# Patient Record
Sex: Female | Born: 1950
Health system: Southern US, Community
[De-identification: ages and names within clinical notes are randomized; demographics above are authoritative.]

## PROBLEM LIST (undated history)

## (undated) DIAGNOSIS — T7840XA Allergy, unspecified, initial encounter: Secondary | ICD-10-CM

## (undated) DIAGNOSIS — C801 Malignant (primary) neoplasm, unspecified: Secondary | ICD-10-CM

## (undated) DIAGNOSIS — M1990 Unspecified osteoarthritis, unspecified site: Secondary | ICD-10-CM

## (undated) HISTORY — PX: TONSILLECTOMY: SUR1361

## (undated) HISTORY — PX: PARTIAL HYSTERECTOMY: SHX80

## (undated) HISTORY — PX: COLONOSCOPY: SHX174

## (undated) HISTORY — DX: Allergy, unspecified, initial encounter: T78.40XA

## (undated) HISTORY — PX: CHOLECYSTECTOMY: SHX55

## (undated) HISTORY — PX: NASAL RECONSTRUCTION: SHX2069

---

## 1999-05-02 ENCOUNTER — Encounter: Payer: Self-pay | Admitting: Obstetrics and Gynecology

## 1999-05-02 ENCOUNTER — Encounter: Admission: RE | Admit: 1999-05-02 | Discharge: 1999-05-02 | Payer: Self-pay | Admitting: Obstetrics and Gynecology

## 2005-10-26 ENCOUNTER — Ambulatory Visit: Payer: Self-pay | Admitting: Internal Medicine

## 2007-12-24 ENCOUNTER — Ambulatory Visit: Payer: Self-pay | Admitting: Internal Medicine

## 2008-01-31 ENCOUNTER — Ambulatory Visit: Payer: Self-pay | Admitting: Internal Medicine

## 2008-01-31 ENCOUNTER — Encounter: Payer: Self-pay | Admitting: Internal Medicine

## 2008-02-03 ENCOUNTER — Encounter: Payer: Self-pay | Admitting: Internal Medicine

## 2009-04-01 ENCOUNTER — Encounter: Admission: RE | Admit: 2009-04-01 | Discharge: 2009-04-01 | Payer: Self-pay | Admitting: Otolaryngology

## 2009-05-07 ENCOUNTER — Ambulatory Visit: Payer: Self-pay | Admitting: Internal Medicine

## 2009-05-07 DIAGNOSIS — Z860100 Personal history of colon polyps, unspecified: Secondary | ICD-10-CM | POA: Insufficient documentation

## 2009-05-07 DIAGNOSIS — E785 Hyperlipidemia, unspecified: Secondary | ICD-10-CM | POA: Insufficient documentation

## 2009-05-07 DIAGNOSIS — R519 Headache, unspecified: Secondary | ICD-10-CM | POA: Insufficient documentation

## 2009-05-07 DIAGNOSIS — R51 Headache: Secondary | ICD-10-CM | POA: Insufficient documentation

## 2009-05-07 DIAGNOSIS — J309 Allergic rhinitis, unspecified: Secondary | ICD-10-CM | POA: Insufficient documentation

## 2010-05-03 NOTE — Assessment & Plan Note (Signed)
Summary: PT RE-EST // RS   Vital Signs:  Patient profile:   60 year old female Height:      64 inches Weight:      161 pounds BMI:     27.74 Temp:     98.6 degrees F oral Pulse rate:   60 / minute BP sitting:   130 / 72  (left arm) Cuff size:   regular  Vitals Entered By: Raechel Ache, RN (May 07, 2009 2:09 PM) CC: to re establish , c/o head "aching" , neck discomfort   CC:  to re establish , c/o head "aching" , and neck discomfort.  History of Present Illness: 60 year old female, who is seen today to reestablish with our practice.  She is followed by Adolph Pollack, allergy and also by gynecology is also had a recent ENT evaluation.  Complaints include some vague fullness in the head and neck discomfort.  She has been a valuated by ENT.  Due to an unusual  symptom complex described as loss of taste sensation when she chews on the right side of her mouth.  A sinus CT scan was negative  Preventive Screening-Counseling & Management  Alcohol-Tobacco     Smoking Status: never  Allergies (verified): No Known Drug Allergies  Past History:  Past Medical History: Allergic rhinitis [place], TEOH) Colonic polyps, hx of Jarold Motto) Headache Hyperlipidemia gravida 3, para 3, aborta zero, status post C-section x 3 ([place])  Past Surgical History: Cholecystectomy 1999 Hysterectomy 1995 Sinus surgery 1980s  Tonsillectomy 1974  Family History: both parents, age  3  father history of coronary artery disease, status post MI, kidney stones, hypertension  Mother, hypercholesterolemia, glaucoma, DJD, hypertension  Two sisters, one with Lyme disease  Social History: Reviewed history and no changes required. Married Never Smoked Smoking Status:  never  Review of Systems  The patient denies anorexia, fever, weight loss, weight gain, vision loss, decreased hearing, hoarseness, chest pain, syncope, dyspnea on exertion, peripheral edema, prolonged cough, headaches, hemoptysis,  abdominal pain, melena, hematochezia, severe indigestion/heartburn, hematuria, incontinence, genital sores, muscle weakness, suspicious skin lesions, transient blindness, difficulty walking, depression, unusual weight change, abnormal bleeding, enlarged lymph nodes, angioedema, and breast masses.    Physical Exam  General:  Well-developed,well-nourished,in no acute distress; alert,appropriate and cooperative throughout examination Head:  Normocephalic and atraumatic without obvious abnormalities. No apparent alopecia or balding. Eyes:  No corneal or conjunctival inflammation noted. EOMI. Perrla. Funduscopic exam benign, without hemorrhages, exudates or papilledema. Vision grossly normal. Ears:  External ear exam shows no significant lesions or deformities.  Otoscopic examination reveals clear canals, tympanic membranes are intact bilaterally without bulging, retraction, inflammation or discharge. Hearing is grossly normal bilaterally. Nose:  External nasal examination shows no deformity or inflammation. Nasal mucosa are pink and moist without lesions or exudates. Mouth:  Oral mucosa and oropharynx without lesions or exudates.  Teeth in good repair. Neck:  No deformities, masses, or tenderness noted. Chest Wall:  No deformities, masses, or tenderness noted. Lungs:  Normal respiratory effort, chest expands symmetrically. Lungs are clear to auscultation, no crackles or wheezes. Heart:  Normal rate and regular rhythm. S1 and S2 normal without gallop, murmur, click, rub or other extra sounds. Abdomen:  Bowel sounds positive,abdomen soft and non-tender without masses, organomegaly or hernias noted. Msk:  No deformity or scoliosis noted of thoracic or lumbar spine.   Pulses:  R and L carotid,radial,femoral,dorsalis pedis and posterior tibial pulses are full and equal bilaterally Extremities:  No clubbing, cyanosis, edema, or  deformity noted with normal full range of motion of all joints.     Impression  & Recommendations:  Problem # 1:  HYPERLIPIDEMIA (ICD-272.4)  Problem # 2:  COLONIC POLYPS, HX OF (ICD-V12.72)  Problem # 3:  ALLERGIC RHINITIS (ICD-477.9)  Her updated medication list for this problem includes:    Allegra 180 Mg Tabs (Fexofenadine hcl) .Marland Kitchen... Prn    Astepro 0.15 % Soln (Azelastine hcl) ..... Use daily    Nasonex 50 Mcg/act Susp (Mometasone furoate) ..... Use daily  Complete Medication List: 1)  Premarin 0.625 Mg/gm Crea (Estrogens, conjugated) .... 3 times a week 2)  Allegra 180 Mg Tabs (Fexofenadine hcl) .... Prn 3)  Astepro 0.15 % Soln (Azelastine hcl) .... Use daily 4)  Nasonex 50 Mcg/act Susp (Mometasone furoate) .... Use daily  Patient Instructions: 1)  Please schedule a follow-up appointment as needed.

## 2010-05-03 NOTE — Letter (Signed)
Summary: Patient Notice- Polyp Results  [facility]  [address] [place], [place] [address]   Phone: [phone]  Fax: [phone]        February 03, 2008 MRN: [id number]    [facility] [place] [address] [place], [place]  [address]    Dear Ms. Shutes,  I am pleased to inform you that the colon polyp(s) removed during your recent colonoscopy was (were) found to be benign (no cancer detected) upon pathologic examination.  I recommend you have a repeat colonoscopy examination in 5 years to look for recurrent polyps, as having colon polyps increases your risk for having recurrent polyps or even colon cancer in the future.  Should you develop new or worsening symptoms of abdominal pain, bowel habit changes or bleeding from the rectum or bowels, please schedule an evaluation with either your primary care physician or with me.  Please call us if you are having persistent problems or have questions about your condition that have not been fully answered at this time.   Sincerely,  Iva Boop MD  This letter has been electronically signed by your physician.

## 2010-05-03 NOTE — Procedures (Signed)
Summary: Colonoscopy   Colonoscopy  Procedure date:  01/31/2008  Findings:      Location:  [facility].    Procedures Next Due Date:    Colonoscopy: 02/2013  Patient Name: Krise, Akeya Ryther. MRN:  Procedure Procedures: Colonoscopy CPT: [phone].    with polypectomy. CPT: [id number].  Personnel: Endoscopist: Iva Boop, MD, [facility].  Exam Location: Exam performed in Outpatient Clinic. Outpatient  Patient Consent: Procedure, Alternatives, Risks and Benefits discussed, consent obtained, from patient. Consent was obtained by the RN.  Indications  Surveillance of: Adenomatous Polyp(s). This is an initial surveillance exam. Initial polypectomy was performed in 2004. in Jun. 3 or more Polyps were found at Index Exam. Largest polyp removed was 1 to 5 mm. Pathology of worst  polyp: tubular adenoma. History  Current Medications: Patient is not currently taking Coumadin.  Pre-Exam Physical: Performed Jan 31, 2008. Cardio-pulmonary exam, Rectal exam, HEENT exam , Abdominal exam, Mental status exam WNL.  Comments: Pt. history reviewed/updated, physical exam performed prior to initiation of sedation? YES Exam Exam: Extent of exam reached: Cecum, extent intended: Cecum.  Time to Cecum: 00:03:06. Time for Withdrawl: 00:12:15. Colon retroflexion performed. Images taken. ASA Classification: I. Tolerance: excellent.  Monitoring: Pulse and BP monitoring, Oximetry used. Supplemental O2 given.  Colon Prep Used MiraLax for colon prep. Prep results: good.  Sedation Meds: Patient assessed and found to be appropriate for moderate (conscious) sedation. Fentanyl 75 mcg. given IV. Versed 7 mg. given IV.  Findings - NORMAL EXAM: Cecum to Sigmoid Colon.  - MULTIPLE POLYPS: Sigmoid Colon. minimum size 3 mm, maximum size 5 mm. Procedure:  snare without cautery, removed, Polyp retrieved, 2 polyps Polyps sent to pathology. Path # 1.  HEMORRHOIDS: External. Size: Grade I.    Assessment  Comments: 1) TWO DIMINUTIVE SIGMOID POLYPS REMOVED 2) SMALL EXTERNAL HEMORRHOIDS 3) OTHERWISE NORMAL WITH GOOD PREP 4) PRIOR ADENOMATOUS COLON POLYPS Events  Unplanned Interventions: No intervention was required.  Plans Medication Plan: Await pathology. Continue current medications.  Disposition: After procedure patient sent to recovery. After recovery patient sent home.  Scheduling/Referral: Await pathology to schedule patient. Path Letter, to The Patient,     REPORT OF SURGICAL PATHOLOGY   Case #: [id number] Patient Name: SIROIS, NARCISSUS DETWILER. Office Chart Number:  [id number]   MRN: [id number] Pathologist: Alden Server A. Delila Spence, MD DOB/Age  Jun 07, 1950 (Age: 60)    Gender: F Date Taken:  01/31/2008 Date Received: 01/31/2008   FINAL DIAGNOSIS   ***MICROSCOPIC EXAMINATION AND DIAGNOSIS***   SIGMOID COLON, POLYP(S):  HYPERPLASTIC POLYP.  NO ADENOMATOUS CHANGE OR MALIGNANCY IDENTIFIED.   mj Date Reported:  02/03/2008     Lyn Hollingshead. Delila Spence, MD *** Electronically Signed Out By EAA ***      February 03, 2008 MRN: [id number]    [facility] [place] [address] [place], [place]  [address]    Dear Ms. Kronick,  I am pleased to inform you that the colon polyp(s) removed during your recent colonoscopy was (were) found to be benign (no cancer detected) upon pathologic examination.  I recommend you have a repeat colonoscopy examination in 5 years to look for recurrent polyps, as having colon polyps increases your risk for having recurrent polyps or even colon cancer in the future.  Should you develop new or worsening symptoms of abdominal pain, bowel habit changes or bleeding from the rectum or bowels, please schedule an evaluation with either your primary care physician or with me.  Please call us if you are having  persistent problems or have questions about your condition that have not been fully answered at this time.   Sincerely,  Iva Boop MD  This letter  has been electronically signed by your physician.   Signed by Iva Boop MD on 02/03/2008 at 12:13 PM  ________________________________________________________________________ This report was created from the original endoscopy report, which was reviewed and signed by the above listed endoscopist.

## 2010-05-03 NOTE — Miscellaneous (Signed)
Summary: GI PV  Clinical Lists Changes  Medications: Added new medication of BISACODYL EC 5 MG TBEC (BISACODYL) Day before procedure take 2 at 3pm and 2 at 8pm. - Signed Added new medication of METOCLOPRAMIDE HCL 10 MG  TABS (METOCLOPRAMIDE HCL) As per prep instructions. - Signed Added new medication of MIRALAX   POWD (POLYETHYLENE GLYCOL 3350) As per prep  instructions. - Signed Rx of BISACODYL EC 5 MG TBEC (BISACODYL) Day before procedure take 2 at 3pm and 2 at 8pm.;  #4 x 0;  Signed;  Entered by: Barton Fanny RN;  Authorized by: Iva Boop MD;  Method used: Electronically to [employer]. [facility]. # [phone]*, [address] [place], [place], [place]  [address], Ph: [phone] or [phone], Fax: [phone] Rx of METOCLOPRAMIDE HCL 10 MG  TABS (METOCLOPRAMIDE HCL) As per prep instructions.;  #2 x 0;  Signed;  Entered by: Barton Fanny RN;  Authorized by: Iva Boop MD;  Method used: Electronically to [employer]. [facility]. # [phone]*, [address], [place], [place], [place]  [address], Ph: [phone] or [phone], Fax: [phone] Rx of MIRALAX   POWD (POLYETHYLENE GLYCOL 3350) As per prep  instructions.;  #255gm x 0;  Signed;  Entered by: Barton Fanny RN;  Authorized by: Iva Boop MD;  Method used: Electronically to [employer]. [facility]. # [phone]*, [address], [place], [place], [place]  [address], Ph: [phone] or [phone], Fax: [phone] Observations: Added new observation of NKA: T (12/24/2007 16:04)    Prescriptions: MIRALAX   POWD (POLYETHYLENE GLYCOL 3350) As per prep  instructions.  #255gm x 0   Entered by:   Barton Fanny RN   Authorized by:   Iva Boop MD   Signed by:   Barton Fanny RN on 12/24/2007   Method used:   Electronically to        [employer] [facility]. [address] # [phone]* (retail)       [address]       [place], [place]  [address]       Ph: [phone] or [phone]       Fax: [phone]   RxID:    [id number] METOCLOPRAMIDE HCL 10 MG  TABS (METOCLOPRAMIDE HCL) As per prep instructions.  #2 x 0   Entered by:   Barton Fanny RN   Authorized by:   Iva Boop MD   Signed by:   Barton Fanny RN on 12/24/2007   Method used:   Electronically to        [employer] [facility]. [address] # [phone]* (retail)       [address]       [place], [place]  [address]       Ph: [phone] or [phone]       Fax: [phone]   RxID:   [id number] BISACODYL EC 5 MG TBEC (BISACODYL) Day before procedure take 2 at 3pm and 2 at 8pm.  #4 x 0   Entered by:   Barton Fanny RN   Authorized by:   Iva Boop MD   Signed by:   Barton Fanny RN on 12/24/2007   Method used:   Electronically to        [facility] [address] # [phone]* (retail)       [address]  [place], [place]  [address]       Ph: [phone] or [phone]       Fax: [phone]   RxID:   [id number]

## 2010-11-29 IMAGING — CT CT MAXILLOFACIAL W/O CM
3 series · 16 of 47 positions shown, 19 images · non-contrast
Comparison: None

CLINICAL DATA: Chronic sinusitis.  Previous septoplasty.

CT MAXILLOFACIAL WITHOUT CONTRAST
TECHNIQUE: Multidetector CT imaging of the maxillofacial
structures was performed. Multiplanar CT image reconstructions were
also generated.

[Series 4: ax soft · axial · 0.31mm/px · z∈[-68,+35]mm · 10 of 49 slices shown, 13 images]
[im 4/49  brain]
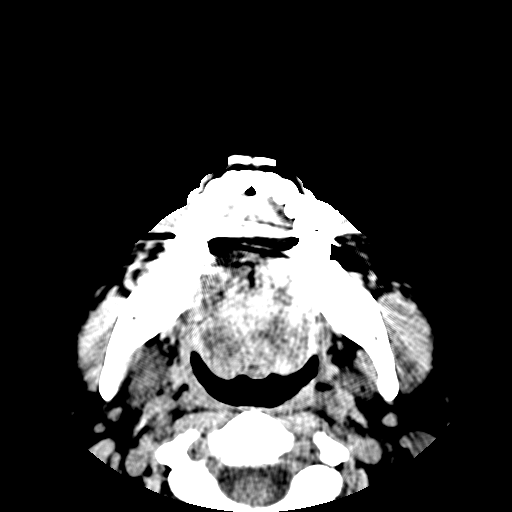
[im 4/49  bone]
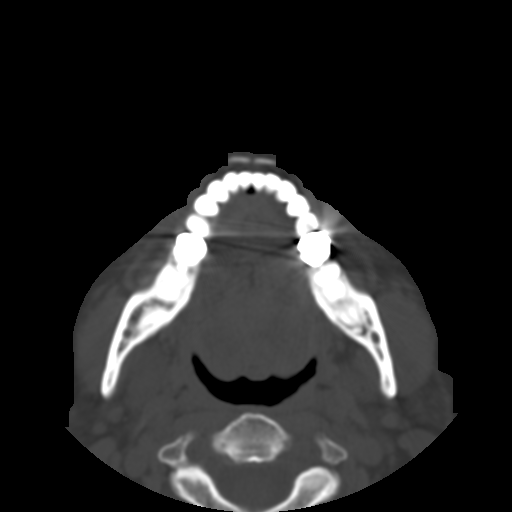
[im 9/49  bone]
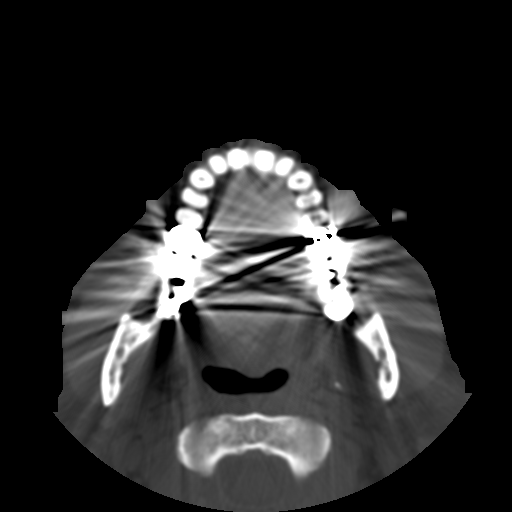
[im 14/49  bone]
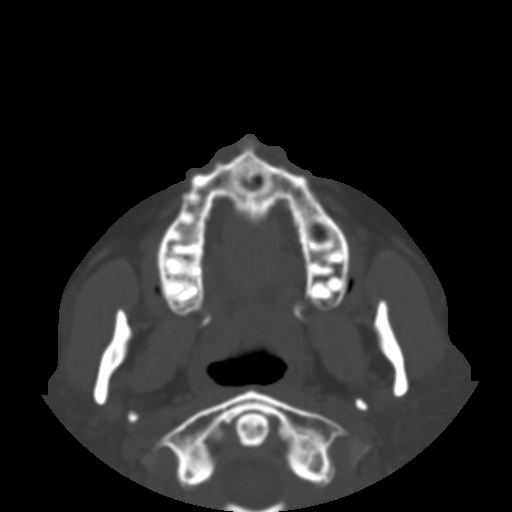
[im 17/49  bone]
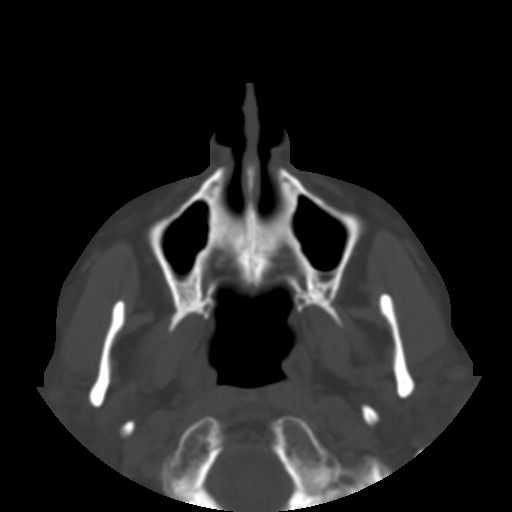
[im 22/49  brain]
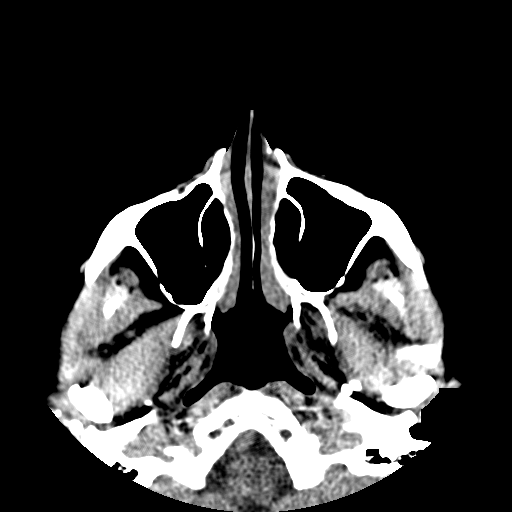
[im 22/49  bone]
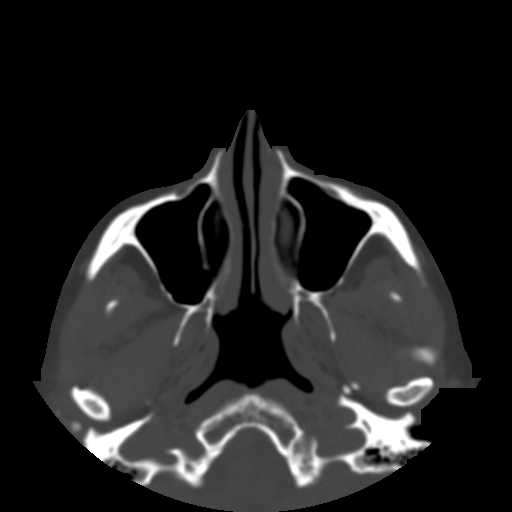
[im 27/49  bone]
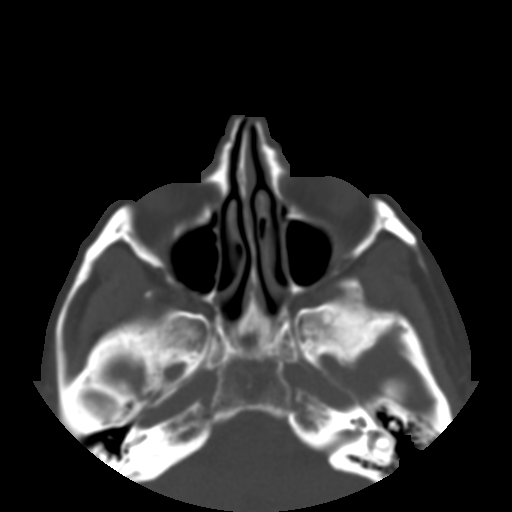
[im 32/49  bone]
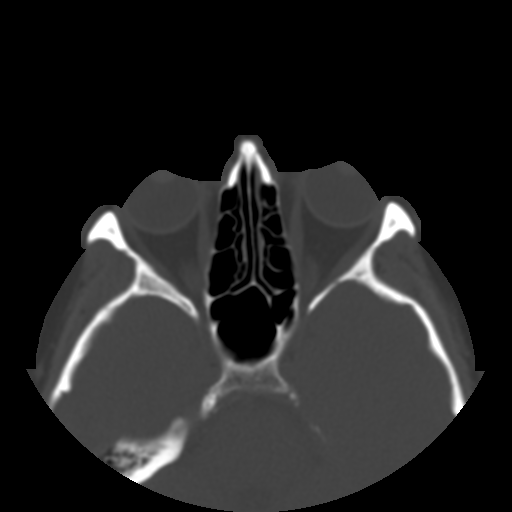
[im 37/49  bone]
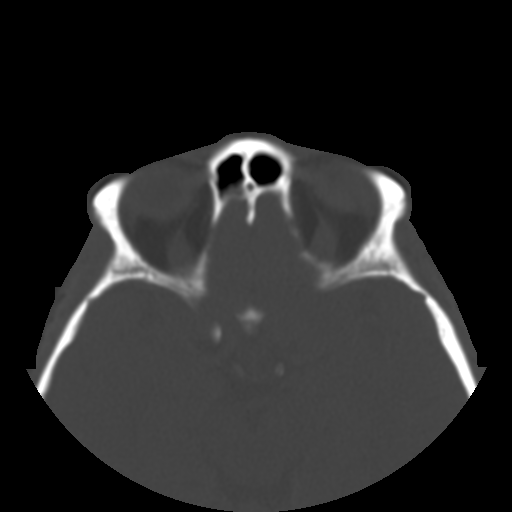
[im 40/49  brain]
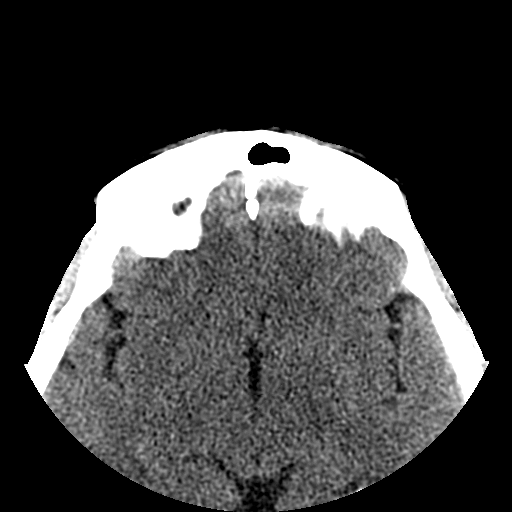
[im 40/49  bone]
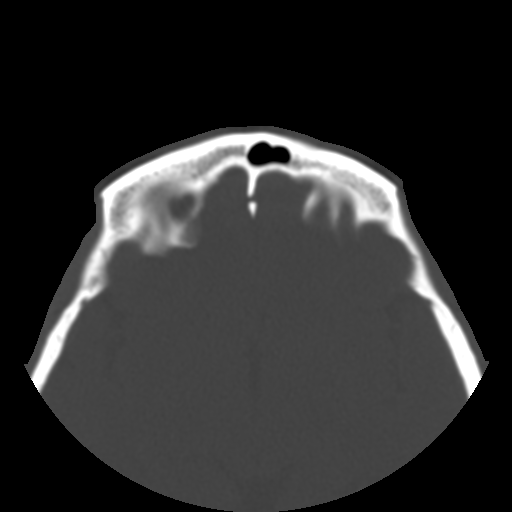
[im 45/49  bone]
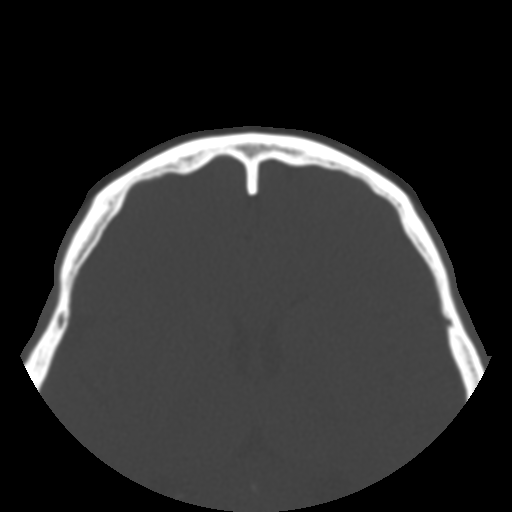

[Series 103: cor soft · coronal · 0.31mm/px · 3 of 55 slices shown]
[im 25/55  bone]
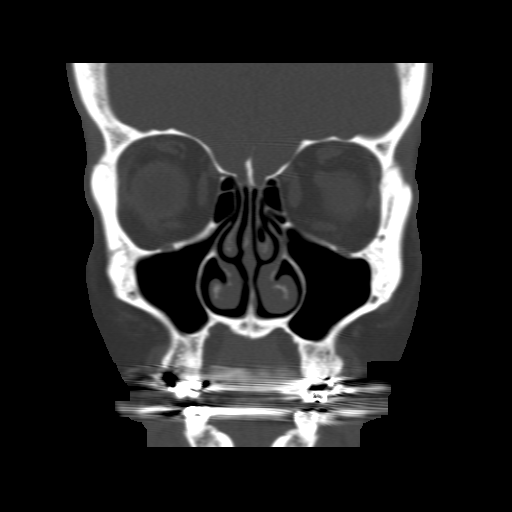
[im 31/55  bone]
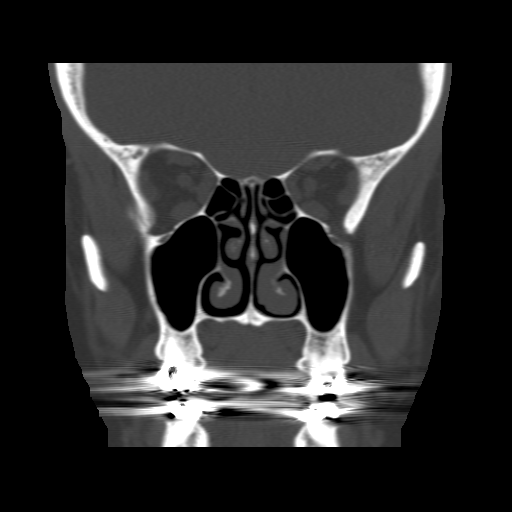
[im 37/55  bone]
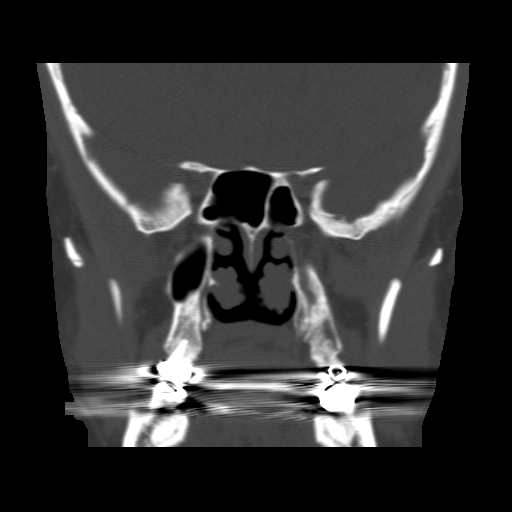

[Series 104: sag soft · sagittal · 0.31mm/px · 3 of 64 slices shown]
[im 22/64  bone]
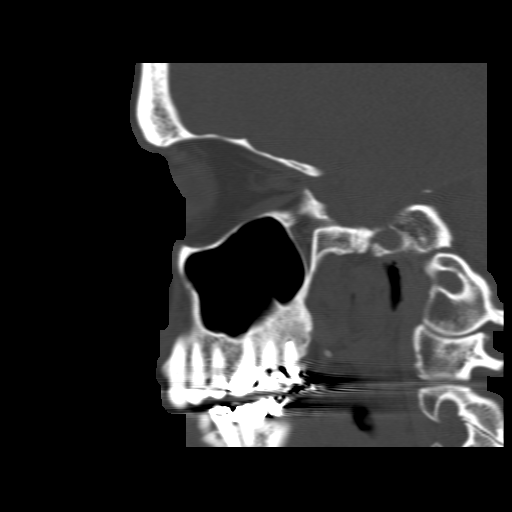
[im 32/64  bone]
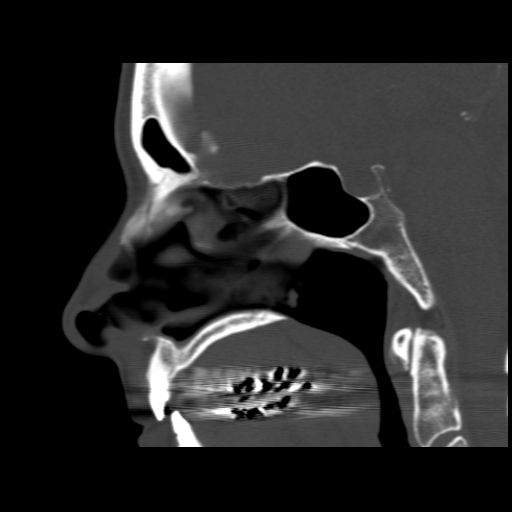
[im 43/64  bone]
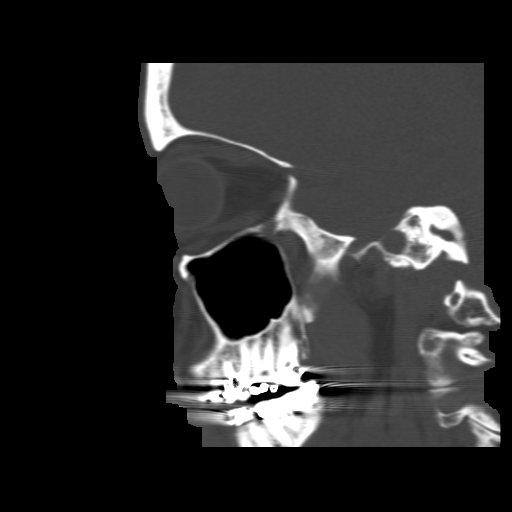

[16 of 47 positions shown; findings below may reference images not displayed]

FINDINGS: Frontal, ethmoid, sphenoid and maxillary sinuses are
completely clear without mucosal thickening, fluid, polyp, cyst or
mass with the exception of two very tiny retention cysts in the
right maxillary sinus, no larger than 3 mm in size each.  Nasal
septum is deviated slightly towards the right.  Ostiomeatal
complexes appear normal bilaterally and widely patent. No
unfavorable anatomic variants are evident.

There does appear to be.  Focal lucency associated with the roots
of the left mandibular molar.  This could be a peri root abscess or
a radicular cyst.
IMPRESSION: No inflammatory disease in the sinuses.  No unfavorable anatomic
variants.  See above discussion.

## 2011-03-23 ENCOUNTER — Other Ambulatory Visit: Payer: Self-pay | Admitting: Dermatology

## 2011-12-05 ENCOUNTER — Telehealth: Payer: Self-pay | Admitting: Internal Medicine

## 2011-12-05 NOTE — Telephone Encounter (Signed)
Pt would like a rx for shingle vaccine sent to [facility]

## 2011-12-05 NOTE — Telephone Encounter (Signed)
rx faxed -attempt to call cell# - VM - LMTCB if questions-  need to be seen - last seen 2011

## 2012-03-19 ENCOUNTER — Other Ambulatory Visit (INDEPENDENT_AMBULATORY_CARE_PROVIDER_SITE_OTHER): Payer: BC Managed Care – PPO

## 2012-03-19 DIAGNOSIS — Z0000 Encounter for general adult medical examination without abnormal findings: Secondary | ICD-10-CM

## 2012-03-19 LAB — CBC WITH DIFFERENTIAL/PLATELET
Basophils Absolute: 0 10*3/uL (ref 0.0–0.1)
Basophils Relative: 0.3 % (ref 0.0–3.0)
Eosinophils Absolute: 0.1 10*3/uL (ref 0.0–0.7)
Eosinophils Relative: 1.5 % (ref 0.0–5.0)
HCT: 39.4 % (ref 36.0–46.0)
Hemoglobin: 13.1 g/dL (ref 12.0–15.0)
Lymphocytes Relative: 41.5 % (ref 12.0–46.0)
Lymphs Abs: 2.5 10*3/uL (ref 0.7–4.0)
MCHC: 33.3 g/dL (ref 30.0–36.0)
MCV: 85.3 fl (ref 78.0–100.0)
Monocytes Absolute: 0.4 10*3/uL (ref 0.1–1.0)
Monocytes Relative: 5.8 % (ref 3.0–12.0)
Neutro Abs: 3.1 10*3/uL (ref 1.4–7.7)
Neutrophils Relative %: 50.9 % (ref 43.0–77.0)
Platelets: 246 10*3/uL (ref 150.0–400.0)
RBC: 4.62 Mil/uL (ref 3.87–5.11)
RDW: 12.6 % (ref 11.5–14.6)
WBC: 6 10*3/uL (ref 4.5–10.5)

## 2012-03-19 LAB — POCT URINALYSIS DIPSTICK
Bilirubin, UA: NEGATIVE
Glucose, UA: NEGATIVE
Ketones, UA: NEGATIVE
Leukocytes, UA: NEGATIVE
Nitrite, UA: NEGATIVE
Protein, UA: NEGATIVE
Spec Grav, UA: 1.015
Urobilinogen, UA: 0.2
pH, UA: 7

## 2012-03-19 LAB — BASIC METABOLIC PANEL
BUN: 14 mg/dL (ref 6–23)
CO2: 24 mEq/L (ref 19–32)
Calcium: 9.2 mg/dL (ref 8.4–10.5)
Chloride: 106 mEq/L (ref 96–112)
Creatinine, Ser: 0.5 mg/dL (ref 0.4–1.2)
GFR: 130.54 mL/min (ref >60.00)
Glucose, Bld: 105 mg/dL — ABNORMAL HIGH (ref 70–99)
Potassium: 3.6 mEq/L (ref 3.5–5.1)
Sodium: 138 mEq/L (ref 135–145)

## 2012-03-19 LAB — HEPATIC FUNCTION PANEL
ALT: 17 U/L (ref 0–35)
AST: 19 U/L (ref 0–37)
Albumin: 4.2 g/dL (ref 3.5–5.2)
Alkaline Phosphatase: 50 U/L (ref 39–117)
Bilirubin, Direct: 0 mg/dL (ref 0.0–0.3)
Total Bilirubin: 0.7 mg/dL (ref 0.3–1.2)
Total Protein: 7 g/dL (ref 6.0–8.3)

## 2012-03-19 LAB — TSH: TSH: 1.74 u[IU]/mL (ref 0.35–5.50)

## 2012-03-19 LAB — LIPID PANEL
Cholesterol: 190 mg/dL (ref 0–200)
HDL: 39.4 mg/dL (ref >39.00)
LDL Cholesterol: 135 mg/dL — ABNORMAL HIGH (ref 0–99)
Total CHOL/HDL Ratio: 5
Triglycerides: 76 mg/dL (ref 0.0–149.0)
VLDL: 15.2 mg/dL (ref 0.0–40.0)

## 2012-03-25 ENCOUNTER — Encounter: Payer: Self-pay | Admitting: Internal Medicine

## 2012-03-25 ENCOUNTER — Ambulatory Visit (INDEPENDENT_AMBULATORY_CARE_PROVIDER_SITE_OTHER): Payer: BC Managed Care – PPO | Admitting: Internal Medicine

## 2012-03-25 VITALS — BP 120/80 | HR 76 | Temp 97.9°F | Resp 18 | Ht 64.0 in | Wt 156.0 lb

## 2012-03-25 DIAGNOSIS — Z0000 Encounter for general adult medical examination without abnormal findings: Secondary | ICD-10-CM

## 2012-03-25 NOTE — Progress Notes (Signed)
Subjective:    Patient ID: Stephanie Henry, female    DOB: [birth year], 61 y.o.   MRN: [id number]  HPI  History of Present Illness:   61 year-old female, who is seen today for a preventive health examination. She is followed by Allergy and also by gynecology is also had a recent ENT evaluation. Complaints include some vague fullness in the head and neck discomfort. She has been a valuated by ENT.   Alcohol-Tobacco  Smoking Status: never   Allergies (verified):  No Known Drug Allergies  Past History:  Past Medical History:  Allergic rhinitis [place], TEOH)  Colonic polyps, hx of Jarold Motto)  Headache  Hyperlipidemia  gravida 3, para 3, aborta zero, status post C-section x 3 ([place])   Past Surgical History:  Cholecystectomy 1999  Hysterectomy 1995  Sinus surgery 1980s  Tonsillectomy 1974  Colonoscopy 2009   Family History:  both parents, age 60  father history of coronary artery disease, status post MI, kidney stones, hypertension  Mother, hypercholesterolemia, glaucoma, DJD, hypertension  Two sisters, one with Lyme disease   Social History:  Reviewed history and no changes required.  Married  Never Smoked  Smoking Status: never   No past medical history on file.  History   Social History  . Marital Status: Married    Spouse Name: N/A    Number of Children: N/A  . Years of Education: N/A   Occupational History  . Not on file.   Social History Main Topics  . Smoking status: Never Smoker   . Smokeless tobacco: Not on file  . Alcohol Use: No  . Drug Use: No  . Sexually Active: Yes -- Female partner(s)   Other Topics Concern  . Not on file   Social History Narrative  . No narrative on file    No past surgical history on file.  No family history on file.  No Known Allergies  Current Outpatient Prescriptions on File Prior to Visit  Medication Sig Dispense Refill  . Calcium Carb-Cholecalciferol (CALCIUM + D3) 600-200 MG-UNIT TABS Take 1 tablet by  mouth daily.        BP 120/80  Pulse 76  Temp 97.9 F (36.6 C) (Oral)  Resp 18  Ht 5\' 4"  (1.626 m)  Wt 156 lb (70.761 kg)  BMI 26.78 kg/m2  SpO2 97%     Review of Systems  Constitutional: Negative for fever, appetite change, fatigue and unexpected weight change.  HENT: Negative for hearing loss, ear pain, nosebleeds, congestion, sore throat, mouth sores, trouble swallowing, neck stiffness, dental problem, voice change, sinus pressure and tinnitus.   Eyes: Negative for photophobia, pain, redness and visual disturbance.  Respiratory: Negative for cough, chest tightness and shortness of breath.   Cardiovascular: Negative for chest pain, palpitations and leg swelling.  Gastrointestinal: Negative for nausea, vomiting, abdominal pain, diarrhea, constipation, blood in stool, abdominal distention and rectal pain.  Genitourinary: Negative for dysuria, urgency, frequency, hematuria, flank pain, vaginal bleeding, vaginal discharge, difficulty urinating, genital sores, vaginal pain, menstrual problem and pelvic pain.  Musculoskeletal: Negative for back pain and arthralgias.  Skin: Negative for rash.  Neurological: Negative for dizziness, syncope, speech difficulty, weakness, light-headedness, numbness and headaches.  Hematological: Negative for adenopathy. Does not bruise/bleed easily.  Psychiatric/Behavioral: Negative for suicidal ideas, behavioral problems, self-injury, dysphoric mood and agitation. The patient is not nervous/anxious.        Objective:   Physical Exam  Constitutional: She is oriented to person, place, and time. She appears well-developed and  well-nourished.  HENT:  Head: Normocephalic.  Right Ear: External ear normal.  Left Ear: External ear normal.  Mouth/Throat: Oropharynx is clear and moist.  Eyes: Conjunctivae normal and EOM are normal. Pupils are equal, round, and reactive to light.  Neck: Normal range of motion. Neck supple. No thyromegaly present.   Cardiovascular: Normal rate, regular rhythm, normal heart sounds and intact distal pulses.   Pulmonary/Chest: Effort normal and breath sounds normal.  Abdominal: Soft. Bowel sounds are normal. She exhibits no mass. There is no tenderness.  Musculoskeletal: Normal range of motion.  Lymphadenopathy:    She has no cervical adenopathy.  Neurological: She is alert and oriented to person, place, and time.  Skin: Skin is warm and dry. No rash noted.  Psychiatric: She has a normal mood and affect. Her behavior is normal.          Assessment & Plan:   preventive health examination  History colonic polyps. Followup colonoscopy 2014  Allergic rhinitis stable   Return office visit one or 2 years

## 2012-03-25 NOTE — Patient Instructions (Signed)
Take a calcium supplement, plus 800-1200 units of vitamin D    It is important that you exercise regularly, at least 20 minutes 3 to 4 times per week.  If you develop chest pain or shortness of breath seek  medical attention.   

## 2013-03-03 ENCOUNTER — Encounter: Payer: Self-pay | Admitting: Internal Medicine

## 2013-04-29 ENCOUNTER — Encounter: Payer: Self-pay | Admitting: Internal Medicine

## 2013-05-19 ENCOUNTER — Ambulatory Visit (AMBULATORY_SURGERY_CENTER): Payer: Self-pay | Admitting: *Deleted

## 2013-05-19 VITALS — Ht 64.5 in | Wt 158.0 lb

## 2013-05-19 DIAGNOSIS — Z86010 Personal history of colonic polyps: Secondary | ICD-10-CM

## 2013-05-19 MED ORDER — NA SULFATE-K SULFATE-MG SULF 17.5-3.13-1.6 GM/177ML PO SOLN: ORAL | Status: DC

## 2013-05-19 NOTE — Progress Notes (Signed)
Patient denies any allergies to eggs or soy. Patient denies any problems with anesthesia.  

## 2013-06-03 ENCOUNTER — Ambulatory Visit (AMBULATORY_SURGERY_CENTER): Payer: BC Managed Care – PPO | Admitting: Internal Medicine

## 2013-06-03 ENCOUNTER — Encounter: Payer: Self-pay | Admitting: Internal Medicine

## 2013-06-03 VITALS — BP 124/60 | HR 67 | Temp 98.6°F | Resp 13 | Ht 64.0 in | Wt 158.0 lb

## 2013-06-03 DIAGNOSIS — Z86010 Personal history of colonic polyps: Secondary | ICD-10-CM

## 2013-06-03 MED ORDER — SODIUM CHLORIDE 0.9 % IV SOLN: 500.0000 mL | INTRAVENOUS | Status: DC

## 2013-06-03 NOTE — Patient Instructions (Addendum)
 No polyps today! Next routine colonoscopy in 10 years - 2025  I appreciate the opportunity to care for you. Carl E. Gessner, MD, FACG  YOU HAD AN ENDOSCOPIC PROCEDURE TODAY AT THE [facility]: Refer to the procedure report that was given to you for any specific questions about what was found during the examination.  If the procedure report does not answer your questions, please call your gastroenterologist to clarify.  If you requested that your care partner not be given the details of your procedure findings, then the procedure report has been included in a sealed envelope for you to review at your convenience later.  YOU SHOULD EXPECT: Some feelings of bloating in the abdomen. Passage of more gas than usual.  Walking can help get rid of the air that was put into your GI tract during the procedure and reduce the bloating. If you had a lower endoscopy (such as a colonoscopy or flexible sigmoidoscopy) you may notice spotting of blood in your stool or on the toilet paper. If you underwent a bowel prep for your procedure, then you may not have a normal bowel movement for a few days.  DIET: Your first meal following the procedure should be a light meal and then it is ok to progress to your normal diet.  A half-sandwich or bowl of soup is an example of a good first meal.  Heavy or fried foods are harder to digest and may make you feel nauseous or bloated.  Likewise meals heavy in dairy and vegetables can cause extra gas to form and this can also increase the bloating.  Drink plenty of fluids but you should avoid alcoholic beverages for 24 hours.  ACTIVITY: Your care partner should take you home directly after the procedure.  You should plan to take it easy, moving slowly for the rest of the day.  You can resume normal activity the day after the procedure however you should NOT DRIVE or use heavy machinery for 24 hours (because of the sedation medicines used during the test).     SYMPTOMS TO REPORT IMMEDIATELY: A gastroenterologist can be reached at any hour.  During normal business hours, 8:30 AM to 5:00 PM Monday through Friday, call [phone].  After hours and on weekends, please call the GI answering service at [phone] who will take a message and have the physician on call contact you.   Following lower endoscopy (colonoscopy or flexible sigmoidoscopy):  Excessive amounts of blood in the stool  Significant tenderness or worsening of abdominal pains  Swelling of the abdomen that is new, acute  Fever of 100F or higher   FOLLOW UP: If any biopsies were taken you will be contacted by phone or by letter within the next 1-3 weeks.  Call your gastroenterologist if you have not heard about the biopsies in 3 weeks.  Our staff will call the home number listed on your records the next business day following your procedure to check on you and address any questions or concerns that you may have at that time regarding the information given to you following your procedure. This is a courtesy call and so if there is no answer at the home number and we have not heard from you through the emergency physician on call, we will assume that you have returned to your regular daily activities without incident.  SIGNATURES/CONFIDENTIALITY: You and/or your care partner have signed paperwork which will be entered into your electronic medical record.  These signatures   to the fact that that the information above on your After Visit Summary has been reviewed and is understood.  Full responsibility of the confidentiality of this discharge information lies with you and/or your care-partner. 

## 2013-06-03 NOTE — Op Note (Signed)
[facility]  [employer] [address]. [place], [address]   COLONOSCOPY PROCEDURE REPORT  PATIENT: Stephanie Henry, Stephanie Henry  MR#: [id number] BIRTHDATE: Feb 03, 1951 , 62  yrs. old GENDER: Female ENDOSCOPIST: Gatha Mayer, MD, [facility] PROCEDURE DATE:  06/03/2013 PROCEDURE:   Colonoscopy, surveillance First Screening Colonoscopy - Avg.  risk and is 50 yrs.  old or older - No.  Prior Negative Screening - Now for repeat screening. N/A  History of Adenoma - Now for follow-up colonoscopy & has been > or = to 3 yrs.  Yes hx of adenoma.  Has been 3 or more years since last colonoscopy.  Polyps Removed Today? No.  Recommend repeat exam, <10 yrs? No. ASA CLASS:   Class I INDICATIONS:Patient's personal history of adenomatous colon polyps.  MEDICATIONS: propofol (Diprivan) 200mg  IV, MAC sedation, administered by CRNA, and These medications were titrated to patient response per physician's verbal order  DESCRIPTION OF PROCEDURE:   After the risks benefits and alternatives of the procedure were thoroughly explained, informed consent was obtained.  A digital rectal exam revealed no abnormalities of the rectum.   The LB [id number]-[id number]  endoscope was introduced through the anus and advanced to the cecum, which was identified by both the appendix and ileocecal valve. No adverse events experienced.   The quality of the prep was Suprep good  The instrument was then slowly withdrawn as the colon was fully examined.      COLON FINDINGS: A normal appearing cecum, ileocecal valve, and appendiceal orifice were identified.  The ascending, hepatic flexure, transverse, splenic flexure, descending, sigmoid colon and rectum appeared unremarkable.  No polyps or cancers were seen.   A right colon retroflexion was performed.  Retroflexed views revealed no abnormalities. The time to cecum=2 minutes 56 seconds. Withdrawal time=9 minutes 21 seconds.  The scope was withdrawn and the procedure  completed. COMPLICATIONS: There were no complications.  ENDOSCOPIC IMPRESSION: Normal colonoscopy - good prep - hx adenoma 2004  RECOMMENDATIONS: Repeat Colonscopy in 10 years - 2025   eSigned:  Gatha Mayer, MD, [facility] 06/03/2013 2:03 PM   cc: The Patient

## 2013-06-04 ENCOUNTER — Telehealth: Payer: Self-pay | Admitting: *Deleted

## 2013-06-04 NOTE — Telephone Encounter (Signed)
  Follow up Call-  Call back number 06/03/2013  Post procedure Call Back phone  # [phone]  Permission to leave phone message Yes     Patient questions:  Do you have a fever, pain , or abdominal swelling? no Pain Score  0 *  Have you tolerated food without any problems? yes  Have you been able to return to your normal activities? yes  Do you have any questions about your discharge instructions: Diet   no Medications  no Follow up visit  no  Do you have questions or concerns about your Care? no  Actions: * If pain score is 4 or above: No action needed, pain <4.

## 2014-09-18 ENCOUNTER — Other Ambulatory Visit: Payer: Self-pay | Admitting: Obstetrics and Gynecology

## 2014-09-21 LAB — CYTOLOGY - PAP

## 2015-10-08 DIAGNOSIS — J3081 Allergic rhinitis due to animal (cat) (dog) hair and dander: Secondary | ICD-10-CM | POA: Diagnosis not present

## 2015-10-08 DIAGNOSIS — J3089 Other allergic rhinitis: Secondary | ICD-10-CM | POA: Diagnosis not present

## 2015-10-18 DIAGNOSIS — J3081 Allergic rhinitis due to animal (cat) (dog) hair and dander: Secondary | ICD-10-CM | POA: Diagnosis not present

## 2015-10-18 DIAGNOSIS — J3089 Other allergic rhinitis: Secondary | ICD-10-CM | POA: Diagnosis not present

## 2015-10-20 DIAGNOSIS — J3081 Allergic rhinitis due to animal (cat) (dog) hair and dander: Secondary | ICD-10-CM | POA: Diagnosis not present

## 2015-10-20 DIAGNOSIS — J3089 Other allergic rhinitis: Secondary | ICD-10-CM | POA: Diagnosis not present

## 2015-11-05 DIAGNOSIS — J3081 Allergic rhinitis due to animal (cat) (dog) hair and dander: Secondary | ICD-10-CM | POA: Diagnosis not present

## 2015-11-05 DIAGNOSIS — J3089 Other allergic rhinitis: Secondary | ICD-10-CM | POA: Diagnosis not present

## 2015-11-09 DIAGNOSIS — J3081 Allergic rhinitis due to animal (cat) (dog) hair and dander: Secondary | ICD-10-CM | POA: Diagnosis not present

## 2015-11-09 DIAGNOSIS — J3089 Other allergic rhinitis: Secondary | ICD-10-CM | POA: Diagnosis not present

## 2015-11-23 DIAGNOSIS — Z85828 Personal history of other malignant neoplasm of skin: Secondary | ICD-10-CM | POA: Diagnosis not present

## 2015-11-23 DIAGNOSIS — L720 Epidermal cyst: Secondary | ICD-10-CM | POA: Diagnosis not present

## 2015-11-23 DIAGNOSIS — L814 Other melanin hyperpigmentation: Secondary | ICD-10-CM | POA: Diagnosis not present

## 2015-11-23 DIAGNOSIS — L570 Actinic keratosis: Secondary | ICD-10-CM | POA: Diagnosis not present

## 2015-11-26 DIAGNOSIS — J3089 Other allergic rhinitis: Secondary | ICD-10-CM | POA: Diagnosis not present

## 2015-11-26 DIAGNOSIS — J3081 Allergic rhinitis due to animal (cat) (dog) hair and dander: Secondary | ICD-10-CM | POA: Diagnosis not present

## 2015-11-30 ENCOUNTER — Encounter: Payer: Self-pay | Admitting: Internal Medicine

## 2015-11-30 DIAGNOSIS — D485 Neoplasm of uncertain behavior of skin: Secondary | ICD-10-CM | POA: Diagnosis not present

## 2015-11-30 DIAGNOSIS — L720 Epidermal cyst: Secondary | ICD-10-CM | POA: Diagnosis not present

## 2015-12-01 DIAGNOSIS — M9903 Segmental and somatic dysfunction of lumbar region: Secondary | ICD-10-CM | POA: Diagnosis not present

## 2015-12-01 DIAGNOSIS — M6080 Other myositis, unspecified site: Secondary | ICD-10-CM | POA: Diagnosis not present

## 2015-12-01 DIAGNOSIS — M9901 Segmental and somatic dysfunction of cervical region: Secondary | ICD-10-CM | POA: Diagnosis not present

## 2015-12-01 DIAGNOSIS — M5440 Lumbago with sciatica, unspecified side: Secondary | ICD-10-CM | POA: Diagnosis not present

## 2015-12-02 DIAGNOSIS — M9903 Segmental and somatic dysfunction of lumbar region: Secondary | ICD-10-CM | POA: Diagnosis not present

## 2015-12-02 DIAGNOSIS — M6080 Other myositis, unspecified site: Secondary | ICD-10-CM | POA: Diagnosis not present

## 2015-12-02 DIAGNOSIS — M5440 Lumbago with sciatica, unspecified side: Secondary | ICD-10-CM | POA: Diagnosis not present

## 2015-12-02 DIAGNOSIS — M9901 Segmental and somatic dysfunction of cervical region: Secondary | ICD-10-CM | POA: Diagnosis not present

## 2015-12-07 DIAGNOSIS — J3089 Other allergic rhinitis: Secondary | ICD-10-CM | POA: Diagnosis not present

## 2015-12-07 DIAGNOSIS — J3081 Allergic rhinitis due to animal (cat) (dog) hair and dander: Secondary | ICD-10-CM | POA: Diagnosis not present

## 2015-12-07 DIAGNOSIS — M5440 Lumbago with sciatica, unspecified side: Secondary | ICD-10-CM | POA: Diagnosis not present

## 2015-12-07 DIAGNOSIS — M9903 Segmental and somatic dysfunction of lumbar region: Secondary | ICD-10-CM | POA: Diagnosis not present

## 2015-12-07 DIAGNOSIS — M6080 Other myositis, unspecified site: Secondary | ICD-10-CM | POA: Diagnosis not present

## 2015-12-07 DIAGNOSIS — M9901 Segmental and somatic dysfunction of cervical region: Secondary | ICD-10-CM | POA: Diagnosis not present

## 2015-12-09 DIAGNOSIS — M6080 Other myositis, unspecified site: Secondary | ICD-10-CM | POA: Diagnosis not present

## 2015-12-09 DIAGNOSIS — M9903 Segmental and somatic dysfunction of lumbar region: Secondary | ICD-10-CM | POA: Diagnosis not present

## 2015-12-09 DIAGNOSIS — M9901 Segmental and somatic dysfunction of cervical region: Secondary | ICD-10-CM | POA: Diagnosis not present

## 2015-12-09 DIAGNOSIS — M5440 Lumbago with sciatica, unspecified side: Secondary | ICD-10-CM | POA: Diagnosis not present

## 2015-12-14 DIAGNOSIS — M6080 Other myositis, unspecified site: Secondary | ICD-10-CM | POA: Diagnosis not present

## 2015-12-14 DIAGNOSIS — M5440 Lumbago with sciatica, unspecified side: Secondary | ICD-10-CM | POA: Diagnosis not present

## 2015-12-14 DIAGNOSIS — M9903 Segmental and somatic dysfunction of lumbar region: Secondary | ICD-10-CM | POA: Diagnosis not present

## 2015-12-14 DIAGNOSIS — M9901 Segmental and somatic dysfunction of cervical region: Secondary | ICD-10-CM | POA: Diagnosis not present

## 2015-12-21 DIAGNOSIS — M6080 Other myositis, unspecified site: Secondary | ICD-10-CM | POA: Diagnosis not present

## 2015-12-21 DIAGNOSIS — M9901 Segmental and somatic dysfunction of cervical region: Secondary | ICD-10-CM | POA: Diagnosis not present

## 2015-12-21 DIAGNOSIS — M5440 Lumbago with sciatica, unspecified side: Secondary | ICD-10-CM | POA: Diagnosis not present

## 2015-12-21 DIAGNOSIS — M9903 Segmental and somatic dysfunction of lumbar region: Secondary | ICD-10-CM | POA: Diagnosis not present

## 2015-12-22 DIAGNOSIS — J3081 Allergic rhinitis due to animal (cat) (dog) hair and dander: Secondary | ICD-10-CM | POA: Diagnosis not present

## 2015-12-22 DIAGNOSIS — J3089 Other allergic rhinitis: Secondary | ICD-10-CM | POA: Diagnosis not present

## 2015-12-30 DIAGNOSIS — J3081 Allergic rhinitis due to animal (cat) (dog) hair and dander: Secondary | ICD-10-CM | POA: Diagnosis not present

## 2015-12-30 DIAGNOSIS — J3089 Other allergic rhinitis: Secondary | ICD-10-CM | POA: Diagnosis not present

## 2016-01-07 DIAGNOSIS — J3089 Other allergic rhinitis: Secondary | ICD-10-CM | POA: Diagnosis not present

## 2016-01-07 DIAGNOSIS — J3081 Allergic rhinitis due to animal (cat) (dog) hair and dander: Secondary | ICD-10-CM | POA: Diagnosis not present

## 2016-01-17 DIAGNOSIS — Z23 Encounter for immunization: Secondary | ICD-10-CM | POA: Diagnosis not present

## 2016-01-17 DIAGNOSIS — J3081 Allergic rhinitis due to animal (cat) (dog) hair and dander: Secondary | ICD-10-CM | POA: Diagnosis not present

## 2016-01-17 DIAGNOSIS — J3089 Other allergic rhinitis: Secondary | ICD-10-CM | POA: Diagnosis not present

## 2016-01-27 DIAGNOSIS — J3081 Allergic rhinitis due to animal (cat) (dog) hair and dander: Secondary | ICD-10-CM | POA: Diagnosis not present

## 2016-01-27 DIAGNOSIS — J3089 Other allergic rhinitis: Secondary | ICD-10-CM | POA: Diagnosis not present

## 2016-02-11 DIAGNOSIS — J3081 Allergic rhinitis due to animal (cat) (dog) hair and dander: Secondary | ICD-10-CM | POA: Diagnosis not present

## 2016-02-11 DIAGNOSIS — J3089 Other allergic rhinitis: Secondary | ICD-10-CM | POA: Diagnosis not present

## 2016-02-21 DIAGNOSIS — J3081 Allergic rhinitis due to animal (cat) (dog) hair and dander: Secondary | ICD-10-CM | POA: Diagnosis not present

## 2016-02-21 DIAGNOSIS — J3089 Other allergic rhinitis: Secondary | ICD-10-CM | POA: Diagnosis not present

## 2016-02-29 DIAGNOSIS — J3081 Allergic rhinitis due to animal (cat) (dog) hair and dander: Secondary | ICD-10-CM | POA: Diagnosis not present

## 2016-02-29 DIAGNOSIS — J3089 Other allergic rhinitis: Secondary | ICD-10-CM | POA: Diagnosis not present

## 2016-03-01 DIAGNOSIS — Z85828 Personal history of other malignant neoplasm of skin: Secondary | ICD-10-CM | POA: Diagnosis not present

## 2016-03-01 DIAGNOSIS — D2262 Melanocytic nevi of left upper limb, including shoulder: Secondary | ICD-10-CM | POA: Diagnosis not present

## 2016-03-01 DIAGNOSIS — L920 Granuloma annulare: Secondary | ICD-10-CM | POA: Diagnosis not present

## 2016-03-01 DIAGNOSIS — L905 Scar conditions and fibrosis of skin: Secondary | ICD-10-CM | POA: Diagnosis not present

## 2016-03-10 DIAGNOSIS — J3089 Other allergic rhinitis: Secondary | ICD-10-CM | POA: Diagnosis not present

## 2016-03-10 DIAGNOSIS — J3081 Allergic rhinitis due to animal (cat) (dog) hair and dander: Secondary | ICD-10-CM | POA: Diagnosis not present

## 2016-03-20 DIAGNOSIS — J3081 Allergic rhinitis due to animal (cat) (dog) hair and dander: Secondary | ICD-10-CM | POA: Diagnosis not present

## 2016-03-20 DIAGNOSIS — J3089 Other allergic rhinitis: Secondary | ICD-10-CM | POA: Diagnosis not present

## 2016-03-22 DIAGNOSIS — J3089 Other allergic rhinitis: Secondary | ICD-10-CM | POA: Diagnosis not present

## 2016-03-22 DIAGNOSIS — J3081 Allergic rhinitis due to animal (cat) (dog) hair and dander: Secondary | ICD-10-CM | POA: Diagnosis not present

## 2016-03-29 DIAGNOSIS — J3089 Other allergic rhinitis: Secondary | ICD-10-CM | POA: Diagnosis not present

## 2016-03-29 DIAGNOSIS — J3081 Allergic rhinitis due to animal (cat) (dog) hair and dander: Secondary | ICD-10-CM | POA: Diagnosis not present

## 2016-04-07 DIAGNOSIS — J3081 Allergic rhinitis due to animal (cat) (dog) hair and dander: Secondary | ICD-10-CM | POA: Diagnosis not present

## 2016-04-07 DIAGNOSIS — J3089 Other allergic rhinitis: Secondary | ICD-10-CM | POA: Diagnosis not present

## 2016-04-12 DIAGNOSIS — J3081 Allergic rhinitis due to animal (cat) (dog) hair and dander: Secondary | ICD-10-CM | POA: Diagnosis not present

## 2016-04-12 DIAGNOSIS — J3089 Other allergic rhinitis: Secondary | ICD-10-CM | POA: Diagnosis not present

## 2016-04-21 DIAGNOSIS — J3081 Allergic rhinitis due to animal (cat) (dog) hair and dander: Secondary | ICD-10-CM | POA: Diagnosis not present

## 2016-04-21 DIAGNOSIS — J3089 Other allergic rhinitis: Secondary | ICD-10-CM | POA: Diagnosis not present

## 2016-04-27 DIAGNOSIS — J3089 Other allergic rhinitis: Secondary | ICD-10-CM | POA: Diagnosis not present

## 2016-04-27 DIAGNOSIS — J3081 Allergic rhinitis due to animal (cat) (dog) hair and dander: Secondary | ICD-10-CM | POA: Diagnosis not present

## 2016-05-05 DIAGNOSIS — J3089 Other allergic rhinitis: Secondary | ICD-10-CM | POA: Diagnosis not present

## 2016-05-05 DIAGNOSIS — J3081 Allergic rhinitis due to animal (cat) (dog) hair and dander: Secondary | ICD-10-CM | POA: Diagnosis not present

## 2016-05-12 DIAGNOSIS — J3081 Allergic rhinitis due to animal (cat) (dog) hair and dander: Secondary | ICD-10-CM | POA: Diagnosis not present

## 2016-05-12 DIAGNOSIS — J3089 Other allergic rhinitis: Secondary | ICD-10-CM | POA: Diagnosis not present

## 2016-05-12 DIAGNOSIS — J301 Allergic rhinitis due to pollen: Secondary | ICD-10-CM | POA: Diagnosis not present

## 2016-07-20 DIAGNOSIS — J3081 Allergic rhinitis due to animal (cat) (dog) hair and dander: Secondary | ICD-10-CM | POA: Diagnosis not present

## 2016-07-20 DIAGNOSIS — J3089 Other allergic rhinitis: Secondary | ICD-10-CM | POA: Diagnosis not present

## 2016-07-26 DIAGNOSIS — H40023 Open angle with borderline findings, high risk, bilateral: Secondary | ICD-10-CM | POA: Diagnosis not present

## 2016-08-04 DIAGNOSIS — J3089 Other allergic rhinitis: Secondary | ICD-10-CM | POA: Diagnosis not present

## 2016-08-04 DIAGNOSIS — J3081 Allergic rhinitis due to animal (cat) (dog) hair and dander: Secondary | ICD-10-CM | POA: Diagnosis not present

## 2016-08-10 DIAGNOSIS — J3081 Allergic rhinitis due to animal (cat) (dog) hair and dander: Secondary | ICD-10-CM | POA: Diagnosis not present

## 2016-08-10 DIAGNOSIS — J3089 Other allergic rhinitis: Secondary | ICD-10-CM | POA: Diagnosis not present

## 2016-08-21 DIAGNOSIS — J3089 Other allergic rhinitis: Secondary | ICD-10-CM | POA: Diagnosis not present

## 2016-08-21 DIAGNOSIS — J3081 Allergic rhinitis due to animal (cat) (dog) hair and dander: Secondary | ICD-10-CM | POA: Diagnosis not present

## 2016-08-31 DIAGNOSIS — J3081 Allergic rhinitis due to animal (cat) (dog) hair and dander: Secondary | ICD-10-CM | POA: Diagnosis not present

## 2016-08-31 DIAGNOSIS — J3089 Other allergic rhinitis: Secondary | ICD-10-CM | POA: Diagnosis not present

## 2016-09-08 DIAGNOSIS — J3089 Other allergic rhinitis: Secondary | ICD-10-CM | POA: Diagnosis not present

## 2016-09-08 DIAGNOSIS — J3081 Allergic rhinitis due to animal (cat) (dog) hair and dander: Secondary | ICD-10-CM | POA: Diagnosis not present

## 2016-09-12 DIAGNOSIS — J3081 Allergic rhinitis due to animal (cat) (dog) hair and dander: Secondary | ICD-10-CM | POA: Diagnosis not present

## 2016-09-12 DIAGNOSIS — J3089 Other allergic rhinitis: Secondary | ICD-10-CM | POA: Diagnosis not present

## 2016-09-29 DIAGNOSIS — J3089 Other allergic rhinitis: Secondary | ICD-10-CM | POA: Diagnosis not present

## 2016-09-29 DIAGNOSIS — J3081 Allergic rhinitis due to animal (cat) (dog) hair and dander: Secondary | ICD-10-CM | POA: Diagnosis not present

## 2016-10-03 DIAGNOSIS — Z13820 Encounter for screening for osteoporosis: Secondary | ICD-10-CM | POA: Diagnosis not present

## 2016-10-03 DIAGNOSIS — Z1231 Encounter for screening mammogram for malignant neoplasm of breast: Secondary | ICD-10-CM | POA: Diagnosis not present

## 2016-10-03 DIAGNOSIS — N958 Other specified menopausal and perimenopausal disorders: Secondary | ICD-10-CM | POA: Diagnosis not present

## 2016-10-03 DIAGNOSIS — Z01419 Encounter for gynecological examination (general) (routine) without abnormal findings: Secondary | ICD-10-CM | POA: Diagnosis not present

## 2016-10-06 DIAGNOSIS — J3081 Allergic rhinitis due to animal (cat) (dog) hair and dander: Secondary | ICD-10-CM | POA: Diagnosis not present

## 2016-10-06 DIAGNOSIS — J3089 Other allergic rhinitis: Secondary | ICD-10-CM | POA: Diagnosis not present

## 2016-10-11 DIAGNOSIS — J3089 Other allergic rhinitis: Secondary | ICD-10-CM | POA: Diagnosis not present

## 2016-10-11 DIAGNOSIS — J3081 Allergic rhinitis due to animal (cat) (dog) hair and dander: Secondary | ICD-10-CM | POA: Diagnosis not present

## 2016-10-18 DIAGNOSIS — J3081 Allergic rhinitis due to animal (cat) (dog) hair and dander: Secondary | ICD-10-CM | POA: Diagnosis not present

## 2016-10-18 DIAGNOSIS — J3089 Other allergic rhinitis: Secondary | ICD-10-CM | POA: Diagnosis not present

## 2016-10-27 DIAGNOSIS — M25571 Pain in right ankle and joints of right foot: Secondary | ICD-10-CM | POA: Diagnosis not present

## 2016-11-03 DIAGNOSIS — J3089 Other allergic rhinitis: Secondary | ICD-10-CM | POA: Diagnosis not present

## 2016-11-03 DIAGNOSIS — J3081 Allergic rhinitis due to animal (cat) (dog) hair and dander: Secondary | ICD-10-CM | POA: Diagnosis not present

## 2016-11-10 DIAGNOSIS — J3081 Allergic rhinitis due to animal (cat) (dog) hair and dander: Secondary | ICD-10-CM | POA: Diagnosis not present

## 2016-11-10 DIAGNOSIS — J3089 Other allergic rhinitis: Secondary | ICD-10-CM | POA: Diagnosis not present

## 2016-11-14 DIAGNOSIS — J3089 Other allergic rhinitis: Secondary | ICD-10-CM | POA: Diagnosis not present

## 2016-11-14 DIAGNOSIS — J3081 Allergic rhinitis due to animal (cat) (dog) hair and dander: Secondary | ICD-10-CM | POA: Diagnosis not present

## 2016-11-14 DIAGNOSIS — J301 Allergic rhinitis due to pollen: Secondary | ICD-10-CM | POA: Diagnosis not present

## 2016-11-20 DIAGNOSIS — Z85828 Personal history of other malignant neoplasm of skin: Secondary | ICD-10-CM | POA: Diagnosis not present

## 2016-11-20 DIAGNOSIS — L723 Sebaceous cyst: Secondary | ICD-10-CM | POA: Diagnosis not present

## 2016-11-20 DIAGNOSIS — L920 Granuloma annulare: Secondary | ICD-10-CM | POA: Diagnosis not present

## 2016-11-20 DIAGNOSIS — L718 Other rosacea: Secondary | ICD-10-CM | POA: Diagnosis not present

## 2016-11-24 DIAGNOSIS — J3089 Other allergic rhinitis: Secondary | ICD-10-CM | POA: Diagnosis not present

## 2016-11-24 DIAGNOSIS — J3081 Allergic rhinitis due to animal (cat) (dog) hair and dander: Secondary | ICD-10-CM | POA: Diagnosis not present

## 2016-12-06 DIAGNOSIS — J3081 Allergic rhinitis due to animal (cat) (dog) hair and dander: Secondary | ICD-10-CM | POA: Diagnosis not present

## 2016-12-06 DIAGNOSIS — J3089 Other allergic rhinitis: Secondary | ICD-10-CM | POA: Diagnosis not present

## 2016-12-08 ENCOUNTER — Ambulatory Visit (INDEPENDENT_AMBULATORY_CARE_PROVIDER_SITE_OTHER): Payer: BLUE CROSS/BLUE SHIELD | Admitting: Family Medicine

## 2016-12-08 ENCOUNTER — Encounter: Payer: Self-pay | Admitting: Family Medicine

## 2016-12-08 VITALS — BP 128/78 | HR 77 | Resp 12 | Ht 64.0 in | Wt 153.1 lb

## 2016-12-08 DIAGNOSIS — Z1159 Encounter for screening for other viral diseases: Secondary | ICD-10-CM

## 2016-12-08 DIAGNOSIS — Z0000 Encounter for general adult medical examination without abnormal findings: Secondary | ICD-10-CM | POA: Diagnosis not present

## 2016-12-08 DIAGNOSIS — M67441 Ganglion, right hand: Secondary | ICD-10-CM | POA: Diagnosis not present

## 2016-12-08 DIAGNOSIS — J3081 Allergic rhinitis due to animal (cat) (dog) hair and dander: Secondary | ICD-10-CM | POA: Diagnosis not present

## 2016-12-08 DIAGNOSIS — Z23 Encounter for immunization: Secondary | ICD-10-CM | POA: Diagnosis not present

## 2016-12-08 DIAGNOSIS — E785 Hyperlipidemia, unspecified: Secondary | ICD-10-CM | POA: Diagnosis not present

## 2016-12-08 DIAGNOSIS — H905 Unspecified sensorineural hearing loss: Secondary | ICD-10-CM

## 2016-12-08 DIAGNOSIS — Z9189 Other specified personal risk factors, not elsewhere classified: Secondary | ICD-10-CM

## 2016-12-08 DIAGNOSIS — J3089 Other allergic rhinitis: Secondary | ICD-10-CM | POA: Diagnosis not present

## 2016-12-08 DIAGNOSIS — H9190 Unspecified hearing loss, unspecified ear: Secondary | ICD-10-CM | POA: Insufficient documentation

## 2016-12-08 NOTE — Progress Notes (Addendum)
HPI:   Stephanie Henry is a 66 y.o. female, who is here today to establish care.  Former PCP: Dr Burnice Logan Last preventive routine visit: "A while", 5 years ago. She sees her gyn regularly, 10/03/16   Chronic medical problems: Allergies, chondroma left hip, OA, and HLD among some.  She follows with immunologists, Dr Arnell Asal annually, receives weekly allergy shots. She also follows with dermatologists, Hx of pruritic erythematous rash, not sure about Dx but well controlled with topical steroid.  Foot OA: Uses topical Pennsaid as needed and OTC Motrin prn for lower back or foot pain.  Concerns today: She would like to have her CPE today. "Knot" on right hand she noted a few weeks ago, mildly tender.She has appt with ortho. Denies recent trauma.   She lives with husband and younger son.. Independent ADL's and IADL's.  She exercises regularly but has not been consistent with a healthy diet.  She had fall, 5 weeks , seen at ortho acute care: Sprained ankle. Denies depression symptoms.  Per pt report:  Mammogram 10/2016 and negative. Colonoscopy 06/2013, 10 years f/u recommended. DEXA 10/2016 normal  HCV: Not done before.   Fall Risk  12/08/2016  Falls in the past year? Yes  Number falls in past yr: 1  Injury with Fall? Yes  Follow up Falls prevention discussed   Depression screen [facility] 2/9 12/08/2016  Decreased Interest 0  Down, Depressed, Hopeless 0  PHQ - 2 Score 0    Mild hearing loss, gradual onset for the past few years. +Tinnitus, intermittent for years.   Review of Systems  Constitutional: Negative for appetite change, fatigue, fever and unexpected weight change.  HENT: Positive for hearing loss. Negative for dental problem, ear pain, mouth sores, nosebleeds, sore throat, trouble swallowing and voice change.   Eyes: Negative for redness and visual disturbance.  Respiratory: Negative for cough, shortness of breath and wheezing.   Cardiovascular:  Negative for chest pain, palpitations and leg swelling.  Gastrointestinal: Negative for abdominal pain, blood in stool, nausea and vomiting.       No changes in bowel habits.  Endocrine: Negative for cold intolerance, heat intolerance, polydipsia, polyphagia and polyuria.  Genitourinary: Negative for decreased urine volume, dysuria and hematuria.  Musculoskeletal: Positive for arthralgias and back pain. Negative for gait problem and neck pain.  Skin: Negative for color change and rash.  Allergic/Immunologic: Positive for environmental allergies.  Neurological: Negative for syncope, weakness and headaches.  Hematological: Negative for adenopathy. Does not bruise/bleed easily.  Psychiatric/Behavioral: Negative for confusion and sleep disturbance. The patient is not nervous/anxious.   All other systems reviewed and are negative.    Current Outpatient Prescriptions on File Prior to Visit  Medication Sig Dispense Refill  . Calcium Carb-Cholecalciferol (CALCIUM + D3) 600-200 MG-UNIT TABS Take 1 tablet by mouth daily.    Marland Kitchen estradiol (ESTRACE) 0.1 MG/GM vaginal cream Place 0.5 g vaginally 2 (two) times a week.     No current facility-administered medications on file prior to visit.      Past Medical History:  Diagnosis Date  . Allergy    Past Surgical History:  Procedure Laterality Date  . CESAREAN SECTION     x3  . CHOLECYSTECTOMY    . NASAL RECONSTRUCTION    . PARTIAL HYSTERECTOMY    . TONSILLECTOMY      Allergies  Allergen Reactions  . Cephalexin Rash    Family History  Problem Relation Age of Onset  . Hypertension  Mother   . Heart disease Father        CAD  . Hypertension Father   . Colon cancer Neg Hx     Social History   Social History  . Marital status: Married    Spouse name: N/A  . Number of children: N/A  . Years of education: N/A   Social History Main Topics  . Smoking status: Never Smoker  . Smokeless tobacco: Never Used  . Alcohol use No  . Drug  use: No  . Sexual activity: Yes    Partners: Male   Other Topics Concern  . None   Social History Narrative  . None    Vitals:   12/08/16 1129  BP: 128/78  Pulse: 77  Resp: 12  SpO2: 96%   Body mass index is 26.28 kg/m.  Physical Exam  Nursing note and vitals reviewed. Constitutional: She is oriented to person, place, and time. She appears well-developed and well-nourished. No distress.  HENT:  Head: Normocephalic and atraumatic.  Right Ear: Hearing, tympanic membrane, external ear and ear canal normal.  Left Ear: Hearing, tympanic membrane, external ear and ear canal normal.  Mouth/Throat: Uvula is midline, oropharynx is clear and moist and mucous membranes are normal.  Eyes: Pupils are equal, round, and reactive to light. Conjunctivae and EOM are normal.  Neck: No tracheal deviation present. No thyromegaly present.  Cardiovascular: Normal rate and regular rhythm.   No murmur heard. Pulses:      Dorsalis pedis pulses are 2+ on the right side, and 2+ on the left side.  Respiratory: Effort normal and breath sounds normal. No respiratory distress.  GI: Soft. She exhibits no mass. There is no hepatomegaly. There is no tenderness.  Musculoskeletal: She exhibits no edema.  No major deformity or signs of synovitis appreciated. Right hand (carpusl)  with soft mass on dorsal aspect, about 2 cm, not mobile, not tender, defined borders, and no skin changes.   Lymphadenopathy:    She has no cervical adenopathy.       Right: No supraclavicular adenopathy present.       Left: No supraclavicular adenopathy present.  Neurological: She is alert and oriented to person, place, and time. She has normal strength. No cranial nerve deficit. Coordination and gait normal.  Reflex Scores:      Bicep reflexes are 2+ on the right side and 2+ on the left side.      Patellar reflexes are 2+ on the right side and 2+ on the left side. Skin: Skin is warm. No rash noted. No erythema.  Psychiatric:  She has a normal mood and affect. Cognition and memory are normal.  Well groomed, good eye contact.    Hearing Screening   125Hz  250Hz  500Hz  1000Hz  2000Hz  3000Hz  4000Hz  6000Hz  8000Hz   Right ear:   Pass Pass Pass  Fail    Left ear:   Pass Pass Pass  Pass       ASSESSMENT AND PLAN:    Ms Raechelle was seen today for establish care and annual exam.  Diagnoses and all orders for this visit:  Lab Results  Component Value Date   CHOL 186 12/11/2016   HDL 38.60 (L) 12/11/2016   LDLCALC 124 (H) 12/11/2016   TRIG 117.0 12/11/2016   CHOLHDL 5 12/11/2016   Lab Results  Component Value Date   CREATININE 0.71 12/11/2016   BUN 14 12/11/2016   NA 142 12/11/2016   K 3.9 12/11/2016   CL 104 12/11/2016  CO2 28 12/11/2016    Routine general medical examination at a health care facility  We discussed the importance of regular physical activity and healthy diet for prevention of chronic illness and/or complications. Preventive guidelines reviewed. Vaccination updated. Continue following with gyn for her routine female preventive care. Ca++ and vit D supplementation recommended. Next CPE in 1 year.  The 10-year ASCVD risk score Mikey Bussing DC Brooke Bonito., et al., 2013) is: 6.2%   Values used to calculate the score:     Age: 66 years     Sex: Female     Is Non-Hispanic African American: No     Diabetic: No     Tobacco smoker: No     Systolic Blood Pressure: 631 mmHg     Is BP treated: No     HDL Cholesterol: 38.6 mg/dL     Total Cholesterol: 186 mg/dL  Hyperlipidemia, unspecified hyperlipidemia type  Continue low fat diet. Further recommendations will be given according to lab results.  -     Lipid panel; Future -     Basic metabolic panel; Future  Encounter for HCV screening test for high risk patient -     Hepatitis C antibody; Future  Need for pneumococcal vaccination -     Pneumococcal conjugate vaccine 13-valent  Need for influenza vaccination -     Flu vaccine HIGH DOSE  PF  Sensorineural hearing loss (SNHL) of right ear, unspecified hearing status on contralateral side  Hearing screening today otherwise normal, except for right high Hz. She does not think she needs hearing aid at this time but thinks she eventually may do so. We will continue following.  Ganglion, right hand  Educated about Dx. Keep appt with ortho.   She is not fasting today, will come back next week for fasting labs.   Ressie Slevin G. Martinique, MD  [facility]. [place] office.

## 2016-12-08 NOTE — Patient Instructions (Addendum)
A few things to remember from today's visit:   Hyperlipidemia, unspecified hyperlipidemia type    At least 150 minutes of moderate exercise per week, daily brisk walking for 15-30 min is a good exercise option. Healthy diet low in saturated (animal) fats and sweets and consisting of fresh fruits and vegetables, lean meats such as fish and white chicken and whole grains.   - Vaccines:  Tdap vaccine 4-5 years ago.  Shingles vaccine recommended at age 65, could be given after 65 years of age but not sure about insurance coverage.  Pneumonia vaccines:  Prevnar 13 at 65 and Pneumovax at 92.  Screening recommendations for low/normal risk women:  Screening for diabetes at age 69-45 and every 3 years.  Cervical cancer prevention:  Gynecologists.   -Breast cancer:  Mammogram: q 1-2 years.   Colon cancer screening: starts at 65 years old until 65 years old.  Cholesterol disorder screening at age 58 and every 3 years.  Also recommended:  1. Dental visit- Brush and floss your teeth twice daily; visit your dentist twice a year. 2. Eye doctor- Get an eye exam at least every 2 years. 3. Helmet use- Always wear a helmet when riding a bicycle, motorcycle, rollerblading or skateboarding. 4. Safe sex- If you may be exposed to sexually transmitted infections, use a condom. 5. Seat belts- Seat belts can save your live; always wear one. 6. Smoke/Carbon Monoxide detectors- These detectors need to be installed on the appropriate level of your home. Replace batteries at least once a year. 7. Skin cancer- When out in the sun please cover up and use sunscreen 15 SPF or higher. 8. Violence- If anyone is threatening or hurting you, please tell your healthcare provider.  9. Drink alcohol in moderation- Limit alcohol intake to one drink or less per day. Never drink and drive.   Labs on 12/11/16.   Advance directives:  Please see a lawyer and/or go to this website to help you with advanced  directives and designating a health care power of attorney so that your wishes will be followed should you become too ill to make your own medical decisions.  [email or web address]  Please be sure medication list is accurate. If a new problem present, please set up appointment sooner than planned today.

## 2016-12-11 ENCOUNTER — Other Ambulatory Visit (INDEPENDENT_AMBULATORY_CARE_PROVIDER_SITE_OTHER): Payer: BLUE CROSS/BLUE SHIELD

## 2016-12-11 DIAGNOSIS — Z1159 Encounter for screening for other viral diseases: Secondary | ICD-10-CM

## 2016-12-11 DIAGNOSIS — E785 Hyperlipidemia, unspecified: Secondary | ICD-10-CM

## 2016-12-11 DIAGNOSIS — J3089 Other allergic rhinitis: Secondary | ICD-10-CM | POA: Diagnosis not present

## 2016-12-11 DIAGNOSIS — J3081 Allergic rhinitis due to animal (cat) (dog) hair and dander: Secondary | ICD-10-CM | POA: Diagnosis not present

## 2016-12-11 DIAGNOSIS — Z9189 Other specified personal risk factors, not elsewhere classified: Secondary | ICD-10-CM | POA: Diagnosis not present

## 2016-12-11 LAB — BASIC METABOLIC PANEL
BUN: 14 mg/dL (ref 6–23)
CO2: 28 mEq/L (ref 19–32)
Calcium: 9.2 mg/dL (ref 8.4–10.5)
Chloride: 104 mEq/L (ref 96–112)
Creatinine, Ser: 0.71 mg/dL (ref 0.40–1.20)
GFR: 87.76 mL/min (ref >60.00)
Glucose, Bld: 99 mg/dL (ref 70–99)
Potassium: 3.9 mEq/L (ref 3.5–5.1)
Sodium: 142 mEq/L (ref 135–145)

## 2016-12-11 LAB — LIPID PANEL
Cholesterol: 186 mg/dL (ref 0–200)
HDL: 38.6 mg/dL — ABNORMAL LOW (ref >39.00)
LDL Cholesterol: 124 mg/dL — ABNORMAL HIGH (ref 0–99)
NonHDL: 147.83
Total CHOL/HDL Ratio: 5
Triglycerides: 117 mg/dL (ref 0.0–149.0)
VLDL: 23.4 mg/dL (ref 0.0–40.0)

## 2016-12-11 NOTE — Addendum Note (Signed)
Addended by: Tomi Likens on: 12/11/2016 09:01 AM   Modules accepted: Orders

## 2016-12-12 DIAGNOSIS — M1811 Unilateral primary osteoarthritis of first carpometacarpal joint, right hand: Secondary | ICD-10-CM | POA: Diagnosis not present

## 2016-12-12 DIAGNOSIS — M67431 Ganglion, right wrist: Secondary | ICD-10-CM | POA: Diagnosis not present

## 2016-12-12 LAB — HEPATITIS C ANTIBODY
Hepatitis C Ab: NONREACTIVE
SIGNAL TO CUT-OFF: 0.02 (ref <1.00)

## 2016-12-18 ENCOUNTER — Other Ambulatory Visit: Payer: BLUE CROSS/BLUE SHIELD

## 2016-12-29 DIAGNOSIS — J3089 Other allergic rhinitis: Secondary | ICD-10-CM | POA: Diagnosis not present

## 2016-12-29 DIAGNOSIS — J3081 Allergic rhinitis due to animal (cat) (dog) hair and dander: Secondary | ICD-10-CM | POA: Diagnosis not present

## 2016-12-30 ENCOUNTER — Emergency Department (HOSPITAL_COMMUNITY)
Admission: EM | Admit: 2016-12-30 | Discharge: 2016-12-30 | Disposition: A | Discharge status: Home / Self Care | Payer: BLUE CROSS/BLUE SHIELD | Attending: Emergency Medicine | Admitting: Emergency Medicine

## 2016-12-30 ENCOUNTER — Ambulatory Visit (INDEPENDENT_AMBULATORY_CARE_PROVIDER_SITE_OTHER): Payer: BLUE CROSS/BLUE SHIELD | Admitting: Family Medicine

## 2016-12-30 ENCOUNTER — Encounter: Payer: Self-pay | Admitting: Family Medicine

## 2016-12-30 VITALS — BP 130/82 | HR 70 | Temp 98.2°F | Resp 12 | Wt 154.1 lb

## 2016-12-30 DIAGNOSIS — Z79899 Other long term (current) drug therapy: Secondary | ICD-10-CM | POA: Insufficient documentation

## 2016-12-30 DIAGNOSIS — L0291 Cutaneous abscess, unspecified: Secondary | ICD-10-CM

## 2016-12-30 DIAGNOSIS — L0211 Cutaneous abscess of neck: Secondary | ICD-10-CM | POA: Diagnosis not present

## 2016-12-30 DIAGNOSIS — L723 Sebaceous cyst: Secondary | ICD-10-CM | POA: Diagnosis not present

## 2016-12-30 DIAGNOSIS — Z9049 Acquired absence of other specified parts of digestive tract: Secondary | ICD-10-CM | POA: Insufficient documentation

## 2016-12-30 DIAGNOSIS — R221 Localized swelling, mass and lump, neck: Secondary | ICD-10-CM | POA: Diagnosis not present

## 2016-12-30 MED ORDER — LIDOCAINE HCL (PF) 1 % IJ SOLN
10.0000 mL | Freq: Once | INTRAMUSCULAR | Status: AC
  Administered 2016-12-30: 10 mL via INTRADERMAL
  Filled 2016-12-30: qty 10

## 2016-12-30 MED ORDER — HYDROCODONE-ACETAMINOPHEN 5-325 MG PO TABS: 1.0000 | ORAL_TABLET | Freq: Four times a day (QID) | ORAL | 0 refills | Status: DC | PRN

## 2016-12-30 MED ORDER — HYDROCODONE-ACETAMINOPHEN 5-325 MG PO TABS
1.0000 | ORAL_TABLET | Freq: Once | ORAL | Status: AC
  Administered 2016-12-30: 1 via ORAL
  Filled 2016-12-30: qty 1

## 2016-12-30 NOTE — ED Provider Notes (Signed)
[facility] DEPT Provider Note   CSN: [id number] Arrival date & time: 12/30/16  1156     History   Chief Complaint Chief Complaint  Patient presents with  . Cyst    HPI Stephanie Henry is a 66 y.o. female who presents with 8 days of redness and swelling to the right posterior neck. Patient reports that she has a history of an intermittent sebaceous cyst to that area. Patient reports that has become irritated before and she is placed on antibiotics with improvement in symptoms. Patient reports approximate 8 days ago she started noticing some redness and pain to the area. Patient reports she called her dermatologist was not able to get an appointment today called her in a prescription for doxycycline. Patient has been on 5 days of doxycycline. Patient reports that area has continued to enlarge and become more painful. Patient called her dermatologist recommend going to her primary care doctor for incision and drainage. Patient was seen at her primary care doctor, they recommended ED evaluation. Patient denies any fevers, chills, neck pain, numbness/weakness of arms or legs, chest pain, SOB.  The history is provided by the patient.    Past Medical History:  Diagnosis Date  . Allergy     Patient Active Problem List   Diagnosis Date Noted  . Hearing loss 12/08/2016  . Hyperlipidemia 05/07/2009  . ALLERGIC RHINITIS 05/07/2009  . HEADACHE 05/07/2009  . COLONIC POLYPS, HX OF 05/07/2009    Past Surgical History:  Procedure Laterality Date  . CESAREAN SECTION     x3  . CHOLECYSTECTOMY    . NASAL RECONSTRUCTION    . PARTIAL HYSTERECTOMY    . TONSILLECTOMY      OB History    No data available       Home Medications    Prior to Admission medications   Medication Sig Start Date End Date Taking? Authorizing Provider  betamethasone dipropionate (DIPROLENE) 0.05 % cream Apply topically daily.    [provider]  Calcium Carb-Cholecalciferol (CALCIUM + D3) 600-200  MG-UNIT TABS Take 1 tablet by mouth daily.    [provider]  Diclofenac Sodium (PENNSAID TD) Apply topically.    [provider]  docusate sodium (STOOL SOFTENER) 100 MG capsule Take 100 mg by mouth daily as needed for mild constipation.    [provider]  doxycycline (VIBRA-TABS) 100 MG tablet Take 100 mg by mouth. 12/26/16   [provider]  estradiol (ESTRACE) 0.1 MG/GM vaginal cream Place 0.5 g vaginally 2 (two) times a week.    [provider]  HYDROcodone-acetaminophen (NORCO/VICODIN) 5-325 MG tablet Take 1-2 tablets by mouth every 6 (six) hours as needed. 12/30/16   Volanda Napoleon, PA-C  ibuprofen (ADVIL,MOTRIN) 200 MG tablet Take 200-400 mg by mouth daily as needed.    [provider]  Probiotic Product (PROBIOTIC PO) Take 1 tablet by mouth daily.    [provider]    Family History Family History  Problem Relation Age of Onset  . Hypertension Mother   . Heart disease Father        CAD  . Hypertension Father   . Colon cancer Neg Hx     Social History Social History  Substance Use Topics  . Smoking status: Never Smoker  . Smokeless tobacco: Never Used  . Alcohol use No     Allergies   Cephalexin   Review of Systems Review of Systems  Constitutional: Negative for chills and fever.  HENT: Negative for  congestion.   Respiratory: Negative for shortness of breath.   Cardiovascular: Negative for chest pain.  Gastrointestinal: Negative for diarrhea.  Skin: Positive for color change and wound.  Neurological: Negative for weakness and numbness.     Physical Exam Updated Vital Signs BP (!) 153/62 (BP Location: Right Arm)   Pulse 60   Temp 98.1 F (36.7 C) (Oral)   Resp 16   SpO2 99%   Physical Exam  Constitutional: She is oriented to person, place, and time. She appears well-developed and well-nourished.  HENT:  Head: Normocephalic and atraumatic.  Mouth/Throat: Oropharynx is clear and moist and  mucous membranes are normal.  Eyes: Pupils are equal, round, and reactive to light. Conjunctivae, EOM and lids are normal.  Neck: Full passive range of motion without pain.  Full flexion/extension and lateral movement of neck fully intact. No bony midline tenderness. No deformities or crepitus.   Cardiovascular: Normal rate, regular rhythm, normal heart sounds and normal pulses.  Exam reveals no gallop and no friction rub.   No murmur heard. Pulmonary/Chest: Effort normal and breath sounds normal.  Musculoskeletal: Normal range of motion.  Lymphadenopathy:    She has cervical adenopathy.       Right cervical: Superficial cervical adenopathy present.  Neurological: She is alert and oriented to person, place, and time.  Skin: Skin is warm and dry. Capillary refill takes less than 2 seconds. There is erythema.  2 cm area of warmth and erythema with central fluctuance. Mild surrounding induration.   Psychiatric: She has a normal mood and affect. Her speech is normal.  Nursing note and vitals reviewed.    ED Treatments / Results  Labs (all labs ordered are listed, but only abnormal results are displayed) Labs Reviewed - No data to display  EKG  EKG Interpretation None       Radiology No results found.  Procedures .Marland KitchenIncision and Drainage Date/Time: 12/30/2016 5:33 PM Performed by: Providence Lanius A Authorized by: Providence Lanius A   Consent:    Consent obtained:  Verbal   Consent given by:  Patient   Risks discussed:  Bleeding, incomplete drainage and pain Location:    Type:  Abscess   Size:  2   Location:  Neck   Neck location:  R posterior Pre-procedure details:    Skin preparation:  Betadine Anesthesia (see MAR for exact dosages):    Anesthesia method:  Local infiltration   Local anesthetic:  Lidocaine 1% w/o epi Procedure type:    Complexity:  Simple Procedure details:    Needle aspiration: yes     Needle size:  22 G   Incision types:  Single straight    Scalpel blade:  11   Wound management:  Probed and deloculated and irrigated with saline   Drainage:  Purulent and bloody   Drainage amount:  Moderate   Wound treatment:  Drain placed   Packing materials:  1/4 in iodoform gauze Post-procedure details:    Patient tolerance of procedure:  Tolerated well, no immediate complications   (including critical care time)  Medications Ordered in ED Medications  HYDROcodone-acetaminophen (NORCO/VICODIN) 5-325 MG per tablet 1 tablet (1 tablet Oral Given 12/30/16 1624)  lidocaine (PF) (XYLOCAINE) 1 % injection 10 mL (10 mLs Intradermal Given 12/30/16 1624)     Initial Impression / Assessment and Plan / ED Course  I have reviewed the triage vital signs and the nursing notes.  Pertinent labs & imaging results that were available during my care of the patient  were reviewed by me and considered in my medical decision making (see chart for details).     66 yo F who presents with redness, pain and swelling to right posterior neck. Patient has a history of a sebaceous cyst of the right neck. She reports that has been intermittent for the last several months. She's been seen by dermatology but has not had it removed. Patient reports dermatology apparent doxycycline naproxen 5 days ago. She still has a week and half left of her antibiotic course. Patient is afebrile, non-toxic appearing, sitting comfortably on examination table. Vital signs reviewed and stable. Consider sebaceous cyst versus abscess. Also consider that there is a sebaceous cyst with overlying abscess to it. Plan for bedside ultrasound for evaluation. Analgesics provided in the department.  Bedside ultrasound shows cysts but there is questionable abscess. Will plan to do a needle aspiration for evidence of purulent material.  I&D as documented above. Once the area was anesthetized, the needle aspiration showed that there were small amount of purulent material. A full I&D was done. Patient had  moderate amount of purulent material expressed. Patient is currently on doxycycline. Encouraged her to continue taking antibiotics as directed. Instructed wound care precautions at home, including warm compresses to the area. Instructed patient to return to the emergency Department in 2 days for wound recheck and packing removal. Instructed that she must follow with primary care doctor and dermatologist. Imaging and stable at this time. Will plan to give outpatient surgery referral for full removal. Strict return precautions discussed. Patient expresses understanding and agreement to plan.   EMERGENCY DEPARTMENT US SOFT TISSUE INTERPRETATION "Study: Limited Soft Tissue Ultrasound"  INDICATIONS: Pain and Soft tissue infection Multiple views of the body part were obtained in real-time with a multi-frequency linear probe  PERFORMED BY: Myself and Other  IMAGES ARCHIVED?: Yes SIDE:Right  BODY PART:Neck INTERPRETATION:  Abcess present   Final Clinical Impressions(s) / ED Diagnoses   Final diagnoses:  Abscess    New Prescriptions New Prescriptions   HYDROCODONE-ACETAMINOPHEN (NORCO/VICODIN) 5-325 MG TABLET    Take 1-2 tablets by mouth every 6 (six) hours as needed.     Volanda Napoleon, PA-C 12/30/16 1758    Drenda Freeze, MD 01/01/17 [phone]

## 2016-12-30 NOTE — Progress Notes (Signed)
ACUTE VISIT   HPI:  Chief Complaint  Patient presents with  . Cyst    neck, started antibiotics 12/26/16, increased in size since starting antibiotics    [name] is a 66 y.o. female, who is here today complaining of 1-2 weeks of edema,erythema,and pain on right side of her neck. She called her dermatologists a few days ago and abx was called to her pharmacy,Doxycycline, today 4th day.  Because problem getting worse, she called her dermatologist's office yesterday. According to pt, appt was offered but she could not keep it, she was instructed to go to the ER to have it drained but she did not do so.   Other  This is a new problem. The current episode started 1 to 4 weeks ago. The problem has been gradually worsening. Associated symptoms include fatigue, neck pain and swollen glands. Pertinent negatives include no abdominal pain, chills, coughing, fever, headaches, myalgias, nausea, numbness, sore throat, visual change, vomiting or weakness. The treatment provided no relief.   She has similar episodes in the past, for a while. Usually resolved with abx treatment. Neck pain is exacerbated by movement, mildly limited due to pain.  She denies dysphagia,odynophagia, or oral lesions.   Review of Systems  Constitutional: Positive for fatigue. Negative for appetite change, chills and fever.  HENT: Negative for facial swelling, mouth sores, sore throat, trouble swallowing and voice change.   Eyes: Negative for discharge and redness.  Respiratory: Negative for cough, chest tightness, shortness of breath, wheezing and stridor.   Gastrointestinal: Negative for abdominal pain, nausea and vomiting.  Musculoskeletal: Positive for neck pain. Negative for myalgias.  Skin: Negative for wound.  Neurological: Negative for syncope, weakness, numbness and headaches.  Hematological: Positive for adenopathy. Does not bruise/bleed easily.  Psychiatric/Behavioral: Negative for  confusion. The patient is nervous/anxious.       Current Outpatient Prescriptions on File Prior to Visit  Medication Sig Dispense Refill  . betamethasone dipropionate (DIPROLENE) 0.05 % cream Apply topically daily.    . Calcium Carb-Cholecalciferol (CALCIUM + D3) 600-200 MG-UNIT TABS Take 1 tablet by mouth daily.    . Diclofenac Sodium (PENNSAID TD) Apply topically.    . docusate sodium (STOOL SOFTENER) 100 MG capsule Take 100 mg by mouth daily as needed for mild constipation.    Marland Kitchen estradiol (ESTRACE) 0.1 MG/GM vaginal cream Place 0.5 g vaginally 2 (two) times a week.    Marland Kitchen ibuprofen (ADVIL,MOTRIN) 200 MG tablet Take 200-400 mg by mouth daily as needed.    . Probiotic Product (PROBIOTIC PO) Take 1 tablet by mouth daily.     No current facility-administered medications on file prior to visit.      Past Medical History:  Diagnosis Date  . Allergy    Allergies  Allergen Reactions  . Cephalexin Rash    Social History   Social History  . Marital status: Married    Spouse name: N/A  . Number of children: N/A  . Years of education: N/A   Social History Main Topics  . Smoking status: Never Smoker  . Smokeless tobacco: Never Used  . Alcohol use No  . Drug use: No  . Sexual activity: Yes    Partners: Male   Other Topics Concern  . None   Social History Narrative  . None    Vitals:   12/30/16 1046  BP: 130/82  Pulse: 70  Resp: 12  Temp: 98.2 F (36.8 C)  SpO2: 96%   Body  mass index is 26.45 kg/m.    Physical Exam  Nursing note and vitals reviewed. Constitutional: She is oriented to person, place, and time. She appears well-developed. No distress.  HENT:  Head: Normocephalic and atraumatic.  Mouth/Throat: Uvula is midline, oropharynx is clear and moist and mucous membranes are normal.  Eyes: Pupils are equal, round, and reactive to light. Conjunctivae are normal.  Neck:    About 4-5 cm of edema and 3 cm of central erythema,mildly fluctuant, tender. There  is induration that is extending toward right trapezium also tender. Pain is elicited with cervical ROM and limited rotation.  Respiratory: Effort normal and breath sounds normal. No respiratory distress.  Musculoskeletal:       Cervical back: She exhibits decreased range of motion, tenderness and edema. She exhibits no bony tenderness.  Lymphadenopathy:       Head (right side): Occipital (tender) adenopathy present.    She has no cervical adenopathy.  Neurological: She is alert and oriented to person, place, and time. She has normal strength. No cranial nerve deficit. Coordination and gait normal.  Skin: Skin is warm. No rash noted. There is erythema.  Right side of cervical area with a nodular,erythematous,and tender lesion. See neck for details.  Psychiatric: Her speech is normal. Her mood appears anxious.  Well groomed, good eye contact.     ASSESSMENT AND PLAN:   Tanishi was seen today for cyst.  Diagnoses and all orders for this visit:  Abscess of skin of neck  Sebaceous cyst   Educated about Dx's. She is going to need I&D.  Lesion seems to be deep and due to localization I strongly recommended going to the ER across the street now.  She voices understanding, she feels like she can drive and ensures me that she is going now.     Betty G. Martinique, MD  [facility]. [place] office.

## 2016-12-30 NOTE — ED Notes (Signed)
ED Provider at bedside. 

## 2016-12-30 NOTE — Patient Instructions (Signed)
Sent to the ER

## 2016-12-30 NOTE — Discharge Instructions (Signed)
Apply warm compresses to the area or soak the area in warm water to help continue express drainage. You want to the area to stay open. You do not want it to close up.   Keep the wound clean and dry. Gently wash the wound with soap and water and make sure to pat it dry.   Continue taking her antibiotic as directed.  You can take Tylenol or Ibuprofen as directed for pain. You can take the pain medication for severe or breakthrough pain. Do not take tylenol and pain medication at the same time.   Followup with your Primary Care doctor, the ED or your dermatologist  in 2 days for wound recheck and packing removal.   Return to the Emergency Department if you experienced any worsening/spreading redness or swelling, fever, worsening pain, or any other worsening or concerning symptoms.

## 2016-12-30 NOTE — ED Triage Notes (Addendum)
Pt states she has seen a dermatologist for recurrent cyst to right lateral/posterior neck that is large with a head. Pt has taken antibiotics for this , it went away. Pt states it came back, taking doxycycline since Tuesday but the cyst size has gotten larger. Dermatologist sent her to have it drained. Went to PCP but they sent her here to have it drained.

## 2016-12-30 NOTE — ED Notes (Signed)
EDP at bedside with US.  

## 2017-01-03 ENCOUNTER — Other Ambulatory Visit: Payer: Self-pay | Admitting: Surgery

## 2017-01-03 DIAGNOSIS — L723 Sebaceous cyst: Secondary | ICD-10-CM | POA: Diagnosis not present

## 2017-01-03 DIAGNOSIS — L089 Local infection of the skin and subcutaneous tissue, unspecified: Secondary | ICD-10-CM | POA: Diagnosis not present

## 2017-01-05 DIAGNOSIS — J3081 Allergic rhinitis due to animal (cat) (dog) hair and dander: Secondary | ICD-10-CM | POA: Diagnosis not present

## 2017-01-05 DIAGNOSIS — J3089 Other allergic rhinitis: Secondary | ICD-10-CM | POA: Diagnosis not present

## 2017-01-12 DIAGNOSIS — J3089 Other allergic rhinitis: Secondary | ICD-10-CM | POA: Diagnosis not present

## 2017-01-12 DIAGNOSIS — J3081 Allergic rhinitis due to animal (cat) (dog) hair and dander: Secondary | ICD-10-CM | POA: Diagnosis not present

## 2017-01-19 DIAGNOSIS — J3089 Other allergic rhinitis: Secondary | ICD-10-CM | POA: Diagnosis not present

## 2017-01-19 DIAGNOSIS — J3081 Allergic rhinitis due to animal (cat) (dog) hair and dander: Secondary | ICD-10-CM | POA: Diagnosis not present

## 2017-01-23 DIAGNOSIS — M1811 Unilateral primary osteoarthritis of first carpometacarpal joint, right hand: Secondary | ICD-10-CM | POA: Diagnosis not present

## 2017-02-02 DIAGNOSIS — J3089 Other allergic rhinitis: Secondary | ICD-10-CM | POA: Diagnosis not present

## 2017-02-02 DIAGNOSIS — J3081 Allergic rhinitis due to animal (cat) (dog) hair and dander: Secondary | ICD-10-CM | POA: Diagnosis not present

## 2017-02-09 NOTE — Pre-Procedure Instructions (Signed)
Silva Aamodt Odoherty  02/09/2017      [facility], [place] - [address] [place] [place] [address] Phone: [phone] Fax: [phone]    Your procedure is scheduled on February 14, 2017.  Report to [facility] Admitting at 745 AM.  Call this number if you have problems the morning of surgery:  [phone]   Remember:  Do not eat food or drink liquids after midnight.  Take these medicines the morning of surgery with A SIP OF WATER hydrocodone-acetaminophen (norco)-If needed, eye drops-if needed,   7 days prior to surgery STOP taking any diclofenac sodium, Aspirin (unless otherwise instructed by your surgeon), Aleve, Naproxen, Ibuprofen, Motrin, Advil, Goody's, BC's, all herbal medications, fish oil, and all vitamins  Continue all other medications as instructed by your physician except follow the above medication instructions before surgery   Do not wear jewelry, make-up or nail polish.  Do not wear lotions, powders, or perfumes, or deoderant.  Do not shave 48 hours prior to surgery.    Do not bring valuables to the hospital.  [facility] is not responsible for any belongings or valuables.  Contacts, dentures or bridgework may not be worn into surgery.  Leave your suitcase in the car.  After surgery it may be brought to your room.  For patients admitted to the hospital, discharge time will be determined by your treatment team.  Patients discharged the day of surgery will not be allowed to drive home.   Special instructions:   [place]- Preparing For Surgery  Before surgery, you can play an important role. Because skin is not sterile, your skin needs to be as free of germs as possible. You can reduce the number of germs on your skin by washing with CHG (chlorahexidine gluconate) Soap before surgery.  CHG is an antiseptic cleaner which kills germs and bonds with the skin to continue killing  germs even after washing.  Please do not use if you have an allergy to CHG or antibacterial soaps. If your skin becomes reddened/irritated stop using the CHG.  Do not shave (including legs and underarms) for at least 48 hours prior to first CHG shower. It is OK to shave your face.  Please follow these instructions carefully.   1. Shower the NIGHT BEFORE SURGERY and the MORNING OF SURGERY with CHG.   2. If you chose to wash your hair, wash your hair first as usual with your normal shampoo.  3. After you shampoo, rinse your hair and body thoroughly to remove the shampoo.  4. Use CHG as you would any other liquid soap. You can apply CHG directly to the skin and wash gently with a scrungie or a clean washcloth.   5. Apply the CHG Soap to your body ONLY FROM THE NECK DOWN.  Do not use on open wounds or open sores. Avoid contact with your eyes, ears, mouth and genitals (private parts). Wash Face and genitals (private parts)  with your normal soap.  6. Wash thoroughly, paying special attention to the area where your surgery will be performed.  7. Thoroughly rinse your body with warm water from the neck down.  8. DO NOT shower/wash with your normal soap after using and rinsing off the CHG Soap.  9. Pat yourself dry with a CLEAN TOWEL.  10. Wear CLEAN PAJAMAS to bed the night before surgery, wear comfortable clothes the morning  of surgery  11. Place CLEAN SHEETS on your bed the night of your first shower and DO NOT SLEEP WITH PETS.    Day of Surgery: Do not apply any deodorants/lotions. Please wear clean clothes to the hospital/surgery center.     Please read over the following fact sheets that you were given. Pain Booklet, Coughing and Deep Breathing and Surgical Site Infection Prevention

## 2017-02-12 ENCOUNTER — Encounter (HOSPITAL_COMMUNITY): Payer: Self-pay

## 2017-02-12 ENCOUNTER — Encounter (HOSPITAL_COMMUNITY)
Admission: RE | Admit: 2017-02-12 | Discharge: 2017-02-12 | Disposition: A | Discharge status: Home / Self Care | Payer: BLUE CROSS/BLUE SHIELD | Source: Ambulatory Visit | Attending: Surgery | Admitting: Surgery

## 2017-02-12 ENCOUNTER — Other Ambulatory Visit: Payer: Self-pay

## 2017-02-12 DIAGNOSIS — Z87891 Personal history of nicotine dependence: Secondary | ICD-10-CM | POA: Diagnosis not present

## 2017-02-12 DIAGNOSIS — M1990 Unspecified osteoarthritis, unspecified site: Secondary | ICD-10-CM | POA: Diagnosis not present

## 2017-02-12 DIAGNOSIS — J3089 Other allergic rhinitis: Secondary | ICD-10-CM | POA: Diagnosis not present

## 2017-02-12 DIAGNOSIS — J3081 Allergic rhinitis due to animal (cat) (dog) hair and dander: Secondary | ICD-10-CM | POA: Diagnosis not present

## 2017-02-12 DIAGNOSIS — L723 Sebaceous cyst: Secondary | ICD-10-CM | POA: Diagnosis not present

## 2017-02-12 DIAGNOSIS — Z79891 Long term (current) use of opiate analgesic: Secondary | ICD-10-CM | POA: Diagnosis not present

## 2017-02-12 DIAGNOSIS — Z79899 Other long term (current) drug therapy: Secondary | ICD-10-CM | POA: Diagnosis not present

## 2017-02-12 HISTORY — DX: Unspecified osteoarthritis, unspecified site: M19.90

## 2017-02-12 HISTORY — DX: Malignant (primary) neoplasm, unspecified: C80.1

## 2017-02-12 LAB — CBC
HCT: 39.7 % (ref 36.0–46.0)
Hemoglobin: 13.3 g/dL (ref 12.0–15.0)
MCH: 28.9 pg (ref 26.0–34.0)
MCHC: 33.5 g/dL (ref 30.0–36.0)
MCV: 86.1 fL (ref 78.0–100.0)
Platelets: 225 10*3/uL (ref 150–400)
RBC: 4.61 MIL/uL (ref 3.87–5.11)
RDW: 13.2 % (ref 11.5–15.5)
WBC: 6.5 10*3/uL (ref 4.0–10.5)

## 2017-02-12 MED ORDER — CHLORHEXIDINE GLUCONATE CLOTH 2 % EX PADS: 6.0000 | MEDICATED_PAD | Freq: Once | CUTANEOUS | Status: DC

## 2017-02-12 NOTE — Progress Notes (Signed)
PCP: Dr. Betty Martinique  Cardiologist: denies  EKG: pt denies past year  Stress test: pt denies ever  ECHO: pt denies ever  Cardiac Cath: pt denies ever  Chest x-ray: pt denies past year

## 2017-02-13 NOTE — H&P (Signed)
[name]  Location: [facility] Patient #: [id number] DOB: 01-Jan-1951 Married / Language: English / Race: White Female   History of Present Illness The patient is a 66 year old female who presents with skin lesions. This patient is referred by the emergency department for infected sebaceous cyst on the back or neck. She has had sebaceous cyst before but has not had an infection until recently. She was placed on antibiotics by her dermatologist and it improved somewhat but then recurred. She had another course of doxycycline but then it became larger and more painful. She ended up going to the emergency department were an incision and drainage was performed on the infected sebaceous cyst on her posterior neck. Since then, she has improved and is finishing her course of antibiotics. She is otherwise healthy without complaints.   Past Surgical History  Cesarean Section - Multiple  Colon Polyp Removal - Colonoscopy  Gallbladder Surgery - Open  Hysterectomy (not due to cancer) - Complete   Diagnostic Studies History   Colonoscopy  1-5 years ago Mammogram  within last year  Allergies No Known Drug Allergies 01/03/2017 (Marked as Inactive) Allergies Reconciled  Cephalexin *CEPHALOSPORINS*   Medication History  Hydrocodone-Acetaminophen (5-325MG  Tablet, Oral) Active. Betamethasone Dipropionate (0.05% Cream, External) Active. Doxycycline Hyclate (100MG  Tablet, Oral) Active. Fluconazole (150MG  Tablet, Oral) Active. MetroNIDAZOLE (0.75% Lotion, External) Active. Pennsaid (2% Solution, Transdermal) Active. Diclofenac Sodium (0.1% Solution, Ophthalmic) Active. Estrace (0.5MG  Tablet, Oral) Active. Ibuprin (200MG  Tablet, Oral) Active. Probiotic (Oral) Active. Medications Reconciled  Social History Caffeine use  Carbonated beverages, Coffee, Tea. No alcohol use  No drug use  Tobacco use  Former smoker.  Family History Arthritis  Father,  Mother. Breast Cancer  Family Members In General. Heart Disease  Father. Heart disease in female family member before age 15  Hypertension  Mother. Kidney Disease  Father. Thyroid problems  Mother.  Pregnancy / Birth History  Age at menarche  39 years. Age of menopause  58-50 Gravida  3 Irregular periods  Length (months) of breastfeeding  3-6 Maternal age  50-35 Para  3  Other Problems ( Arthritis  Back Pain  Cholelithiasis  Hemorrhoids  Oophorectomy     Review of Systems  General Not Present- Appetite Loss, Chills, Fatigue, Fever, Night Sweats, Weight Gain and Weight Loss. Skin Present- New Lesions. Not Present- Change in Wart/Mole, Dryness, Hives, Jaundice, Non-Healing Wounds, Rash and Ulcer. HEENT Present- Seasonal Allergies and Wears glasses/contact lenses. Not Present- Earache, Hearing Loss, Hoarseness, Nose Bleed, Oral Ulcers, Ringing in the Ears, Sinus Pain, Sore Throat, Visual Disturbances and Yellow Eyes. Respiratory Not Present- Bloody sputum, Chronic Cough, Difficulty Breathing, Snoring and Wheezing. Breast Not Present- Breast Mass, Breast Pain, Nipple Discharge and Skin Changes. Cardiovascular Not Present- Chest Pain, Difficulty Breathing Lying Down, Leg Cramps, Palpitations, Rapid Heart Rate, Shortness of Breath and Swelling of Extremities. Gastrointestinal Not Present- Abdominal Pain, Bloating, Bloody Stool, Change in Bowel Habits, Chronic diarrhea, Constipation, Difficulty Swallowing, Excessive gas, Gets full quickly at meals, Hemorrhoids, Indigestion, Nausea, Rectal Pain and Vomiting. Female Genitourinary Not Present- Frequency, Nocturia, Painful Urination, Pelvic Pain and Urgency. Musculoskeletal Present- Joint Pain. Not Present- Back Pain, Joint Stiffness, Muscle Pain, Muscle Weakness and Swelling of Extremities. Neurological Not Present- Decreased Memory, Fainting, Headaches, Numbness, Seizures, Tingling, Tremor, Trouble walking and  Weakness. Psychiatric Not Present- Anxiety, Bipolar, Change in Sleep Pattern, Depression, Fearful and Frequent crying. Endocrine Not Present- Cold Intolerance, Excessive Hunger, Hair Changes, Heat Intolerance, Hot flashes and New Diabetes. Hematology Not  Present- Blood Thinners, Easy Bruising, Excessive bleeding, Gland problems, HIV and Persistent Infections.  Vitals   Weight: 154 lb Height: 64in Body Surface Area: 1.75 m Body Mass Index: 26.43 kg/m  Temp.: 98.57F  Pulse: 87 (Regular)  BP: 135/82 (Sitting, Left Arm, Standard)    Physical Exam  The physical exam findings are as follows: Note:On exam, she is one appearance On her posterior neck, there is a well-healed incision with surrounding mild induration just to the right of the midline consistent with her infected sebaceous cyst I&D site Lungs clear CV RRR Abdomen soft, non=tender    Assessment & Plan   INFECTED SEBACEOUS CYST (L72.3)  Impression: We discussed the diagnosis in detail. As this is now been infected twice, complete surgical excision of the chronically infected sebaceous cyst on the back of her neck is recommended. We discussed the surgical procedure in detail. I discussed the risks of bleeding, infection, having an open wound, and recurrence with her. She understands and wished to proceed with surgery which will be scheduled

## 2017-02-14 ENCOUNTER — Encounter (HOSPITAL_COMMUNITY): Payer: Self-pay | Admitting: Anesthesiology

## 2017-02-14 ENCOUNTER — Ambulatory Visit (HOSPITAL_COMMUNITY): Payer: BLUE CROSS/BLUE SHIELD | Admitting: Anesthesiology

## 2017-02-14 ENCOUNTER — Ambulatory Visit (HOSPITAL_COMMUNITY)
Admission: RE | Admit: 2017-02-14 | Discharge: 2017-02-14 | Disposition: A | Discharge status: Home / Self Care | Payer: BLUE CROSS/BLUE SHIELD | Source: Ambulatory Visit | Attending: Surgery | Admitting: Surgery

## 2017-02-14 ENCOUNTER — Encounter (HOSPITAL_COMMUNITY)
Admission: RE | Disposition: A | Discharge status: Home / Self Care | Payer: Self-pay | Source: Ambulatory Visit | Attending: Surgery

## 2017-02-14 DIAGNOSIS — L723 Sebaceous cyst: Secondary | ICD-10-CM | POA: Insufficient documentation

## 2017-02-14 DIAGNOSIS — Z87891 Personal history of nicotine dependence: Secondary | ICD-10-CM | POA: Diagnosis not present

## 2017-02-14 DIAGNOSIS — Z79891 Long term (current) use of opiate analgesic: Secondary | ICD-10-CM | POA: Diagnosis not present

## 2017-02-14 DIAGNOSIS — M1990 Unspecified osteoarthritis, unspecified site: Secondary | ICD-10-CM | POA: Diagnosis not present

## 2017-02-14 DIAGNOSIS — Z79899 Other long term (current) drug therapy: Secondary | ICD-10-CM | POA: Diagnosis not present

## 2017-02-14 DIAGNOSIS — J309 Allergic rhinitis, unspecified: Secondary | ICD-10-CM | POA: Diagnosis not present

## 2017-02-14 DIAGNOSIS — E785 Hyperlipidemia, unspecified: Secondary | ICD-10-CM | POA: Diagnosis not present

## 2017-02-14 DIAGNOSIS — L720 Epidermal cyst: Secondary | ICD-10-CM | POA: Diagnosis not present

## 2017-02-14 DIAGNOSIS — H9190 Unspecified hearing loss, unspecified ear: Secondary | ICD-10-CM | POA: Diagnosis not present

## 2017-02-14 HISTORY — PX: LIPOMA EXCISION: SHX5283

## 2017-02-14 SURGERY — EXCISION LIPOMA
Anesthesia: Monitor Anesthesia Care | Site: Neck

## 2017-02-14 MED ORDER — 0.9 % SODIUM CHLORIDE (POUR BTL) OPTIME
TOPICAL | Status: DC | PRN
  Administered 2017-02-14: 1000 mL

## 2017-02-14 MED ORDER — CIPROFLOXACIN IN D5W 400 MG/200ML IV SOLN
400.0000 mg | INTRAVENOUS | Status: AC
  Administered 2017-02-14: 400 mg via INTRAVENOUS

## 2017-02-14 MED ORDER — LIDOCAINE 2% (20 MG/ML) 5 ML SYRINGE
INTRAMUSCULAR | Status: AC
  Filled 2017-02-14: qty 10

## 2017-02-14 MED ORDER — DEXAMETHASONE SODIUM PHOSPHATE 10 MG/ML IJ SOLN
INTRAMUSCULAR | Status: AC
  Filled 2017-02-14: qty 1

## 2017-02-14 MED ORDER — FENTANYL CITRATE (PF) 250 MCG/5ML IJ SOLN
INTRAMUSCULAR | Status: DC | PRN
  Administered 2017-02-14 (×2): 25 ug via INTRAVENOUS

## 2017-02-14 MED ORDER — DEXAMETHASONE SODIUM PHOSPHATE 10 MG/ML IJ SOLN
INTRAMUSCULAR | Status: DC | PRN
Start: 2017-02-14 — End: 2017-02-14
  Administered 2017-02-14: 4 mg via INTRAVENOUS

## 2017-02-14 MED ORDER — MIDAZOLAM HCL 5 MG/5ML IJ SOLN
INTRAMUSCULAR | Status: DC | PRN
Start: 2017-02-14 — End: 2017-02-14
  Administered 2017-02-14: 2 mg via INTRAVENOUS

## 2017-02-14 MED ORDER — CIPROFLOXACIN IN D5W 400 MG/200ML IV SOLN
INTRAVENOUS | Status: AC
  Filled 2017-02-14: qty 200

## 2017-02-14 MED ORDER — LIDOCAINE HCL 1 % IJ SOLN
INTRAMUSCULAR | Status: DC | PRN
  Administered 2017-02-14: 10 mL

## 2017-02-14 MED ORDER — PROPOFOL 10 MG/ML IV BOLUS
INTRAVENOUS | Status: AC
  Filled 2017-02-14: qty 20

## 2017-02-14 MED ORDER — PROPOFOL 10 MG/ML IV BOLUS
INTRAVENOUS | Status: DC | PRN
  Administered 2017-02-14 (×3): 10 mg via INTRAVENOUS

## 2017-02-14 MED ORDER — ONDANSETRON HCL 4 MG/2ML IJ SOLN
INTRAMUSCULAR | Status: DC | PRN
  Administered 2017-02-14: 4 mg via INTRAVENOUS

## 2017-02-14 MED ORDER — BUPIVACAINE-EPINEPHRINE (PF) 0.25% -1:200000 IJ SOLN
INTRAMUSCULAR | Status: AC
  Filled 2017-02-14: qty 30

## 2017-02-14 MED ORDER — FENTANYL CITRATE (PF) 250 MCG/5ML IJ SOLN
INTRAMUSCULAR | Status: AC
  Filled 2017-02-14: qty 5

## 2017-02-14 MED ORDER — ARTIFICIAL TEARS OPHTHALMIC OINT
TOPICAL_OINTMENT | OPHTHALMIC | Status: AC
  Filled 2017-02-14: qty 3.5

## 2017-02-14 MED ORDER — METOCLOPRAMIDE HCL 5 MG/ML IJ SOLN
INTRAMUSCULAR | Status: AC
  Filled 2017-02-14: qty 2

## 2017-02-14 MED ORDER — MIDAZOLAM HCL 2 MG/2ML IJ SOLN
INTRAMUSCULAR | Status: AC
  Filled 2017-02-14: qty 2

## 2017-02-14 MED ORDER — OXYCODONE HCL 5 MG PO TABS: 5.0000 mg | ORAL_TABLET | Freq: Four times a day (QID) | ORAL | 0 refills | Status: DC | PRN

## 2017-02-14 MED ORDER — LACTATED RINGERS IV SOLN
INTRAVENOUS | Status: DC
  Administered 2017-02-14: 09:00:00 via INTRAVENOUS

## 2017-02-14 MED ORDER — ONDANSETRON HCL 4 MG/2ML IJ SOLN
INTRAMUSCULAR | Status: AC
  Filled 2017-02-14: qty 2

## 2017-02-14 MED ORDER — LIDOCAINE HCL (PF) 1 % IJ SOLN
INTRAMUSCULAR | Status: AC
  Filled 2017-02-14: qty 30

## 2017-02-14 SURGICAL SUPPLY — 39 items
ADH SKN CLS APL DERMABOND .7 (GAUZE/BANDAGES/DRESSINGS) ×1
CANISTER SUCT 3000ML PPV (MISCELLANEOUS) IMPLANT
COVER SURGICAL LIGHT HANDLE (MISCELLANEOUS) ×2 IMPLANT
DECANTER SPIKE VIAL GLASS SM (MISCELLANEOUS) IMPLANT
DERMABOND ADVANCED (GAUZE/BANDAGES/DRESSINGS) ×1
DERMABOND ADVANCED .7 DNX12 (GAUZE/BANDAGES/DRESSINGS) ×1 IMPLANT
DRAPE LAPAROSCOPIC ABDOMINAL (DRAPES) IMPLANT
DRAPE LAPAROTOMY 100X72 PEDS (DRAPES) ×1 IMPLANT
DRAPE UTILITY XL STRL (DRAPES) ×4 IMPLANT
ELECT CAUTERY BLADE 6.4 (BLADE) ×2 IMPLANT
ELECT REM PT RETURN 9FT ADLT (ELECTROSURGICAL) ×2
ELECTRODE REM PT RTRN 9FT ADLT (ELECTROSURGICAL) ×1 IMPLANT
GAUZE SPONGE 4X4 12PLY STRL (GAUZE/BANDAGES/DRESSINGS) ×2 IMPLANT
GAUZE SPONGE 4X4 16PLY XRAY LF (GAUZE/BANDAGES/DRESSINGS) ×2 IMPLANT
GLOVE SURG SIGNA 7.5 PF LTX (GLOVE) ×2 IMPLANT
GOWN STRL REUS W/ TWL LRG LVL3 (GOWN DISPOSABLE) ×1 IMPLANT
GOWN STRL REUS W/ TWL XL LVL3 (GOWN DISPOSABLE) ×1 IMPLANT
GOWN STRL REUS W/TWL LRG LVL3 (GOWN DISPOSABLE) ×2
GOWN STRL REUS W/TWL XL LVL3 (GOWN DISPOSABLE) ×2
KIT BASIN OR (CUSTOM PROCEDURE TRAY) ×2 IMPLANT
KIT ROOM TURNOVER OR (KITS) ×2 IMPLANT
NDL HYPO 25GX1X1/2 BEV (NEEDLE) ×1 IMPLANT
NEEDLE HYPO 25GX1X1/2 BEV (NEEDLE) ×2 IMPLANT
NS IRRIG 1000ML POUR BTL (IV SOLUTION) ×2 IMPLANT
PACK SURGICAL SETUP 50X90 (CUSTOM PROCEDURE TRAY) ×2 IMPLANT
PAD ARMBOARD 7.5X6 YLW CONV (MISCELLANEOUS) ×4 IMPLANT
PENCIL BUTTON HOLSTER BLD 10FT (ELECTRODE) ×2 IMPLANT
SPECIMEN JAR SMALL (MISCELLANEOUS) ×2 IMPLANT
SPONGE LAP 18X18 X RAY DECT (DISPOSABLE) ×2 IMPLANT
SUT MNCRL AB 4-0 PS2 18 (SUTURE) ×2 IMPLANT
SUT VIC AB 3-0 SH 27 (SUTURE) ×2
SUT VIC AB 3-0 SH 27XBRD (SUTURE) ×1 IMPLANT
SYR BULB 3OZ (MISCELLANEOUS) ×2 IMPLANT
SYR CONTROL 10ML LL (SYRINGE) ×2 IMPLANT
TOWEL OR 17X24 6PK STRL BLUE (TOWEL DISPOSABLE) ×2 IMPLANT
TOWEL OR 17X26 10 PK STRL BLUE (TOWEL DISPOSABLE) ×2 IMPLANT
TUBE CONNECTING 12X1/4 (SUCTIONS) IMPLANT
WATER STERILE IRR 1000ML POUR (IV SOLUTION) IMPLANT
YANKAUER SUCT BULB TIP NO VENT (SUCTIONS) IMPLANT

## 2017-02-14 NOTE — Transfer of Care (Signed)
Immediate Anesthesia Transfer of Care Note  Patient: Stephanie Henry  Procedure(s) Performed: EXCISION POSTERIOR NECK CYST (N/A Neck)  Patient Location: PACU  Anesthesia Type:MAC  Level of Consciousness: awake, alert  and oriented  Airway & Oxygen Therapy: Patient Spontanous Breathing  Post-op Assessment: Report given to RN and Post -op Vital signs reviewed and stable  Post vital signs: Reviewed and stable  Last Vitals:  Vitals:   02/14/17 0755  BP: (!) 127/56  Pulse: 68  Temp: 36.7 C  SpO2: 99%    Last Pain:  Vitals:   02/14/17 0755  TempSrc: Oral         Complications: No apparent anesthesia complications

## 2017-02-14 NOTE — Interval H&P Note (Signed)
History and Physical Interval Note: no change in H and P  02/14/2017 8:59 AM  Stephanie Henry  has presented today for surgery, with the diagnosis of chronically infected sebaceous cyst posterior neck  The various methods of treatment have been discussed with the patient and family. After consideration of risks, benefits and other options for treatment, the patient has consented to  Procedure(s): EXCISION POSTERIOR NECK CYST (N/A) as a surgical intervention .  The patient's history has been reviewed, patient examined, no change in status, stable for surgery.  I have reviewed the patient's chart and labs.  Questions were answered to the patient's satisfaction.     Kasch Borquez A

## 2017-02-14 NOTE — Op Note (Signed)
EXCISION POSTERIOR NECK CYST  Procedure Note  Stephanie Henry 02/14/2017   Pre-op Diagnosis: chronically infected sebaceous cyst posterior neck     Post-op Diagnosis: same  Procedure(s): EXCISION POSTERIOR NECK CYST (1 cm)  Surgeon(s): Coralie Keens, MD  Anesthesia: Local  Staff:  Circulator: Nicholos Johns, RN Scrub Person: Delrae Rend Circulator Assistant: Paulette Blanch, RN  Estimated Blood Loss: Minimal               Specimens: sent to path  Procedure: The patient was brought to the operating room and identified as the correct patient.  She was placed supine on the operating room table.  She was then placed into the left lateral cavus position.  Anesthesia was then induced.  Her posterior neck was then prepped and draped in usual sterile fashion.  The sebaceous cyst was just to the right of the midline.  I anesthetized the skin at the previous scar with lidocaine.  I then made an elliptical incision around the visible cyst with a scalpel and excised the cyst in its entirety with the scalpel.  I then achieved hemostasis with cautery.  I anesthetized the wound further with lidocaine.  I then closed the incision with 3-0 Vicryl sutures and a running 4-0 Monocryl suture.  Dermabond was then applied.  The patient tolerated the procedure well.  All the counts were correct at the end of the procedure.  Specimen was sent to pathology for evaluation.  The patient was then taken in stable condition from the operating room to the recovery room.          Stephanie Henry A   Date: 02/14/2017  Time: 9:39 AM

## 2017-02-14 NOTE — Anesthesia Preprocedure Evaluation (Addendum)
Anesthesia Evaluation  Patient identified by MRN, date of birth, ID band Patient awake    Reviewed: Allergy & Precautions, NPO status , Patient's Chart, lab work & pertinent test results  Airway Mallampati: II  TM Distance: >3 FB Neck ROM: Full    Dental no notable dental hx. (+) Teeth Intact   Pulmonary former smoker,    Pulmonary exam normal breath sounds clear to auscultation       Cardiovascular negative cardio ROS Normal cardiovascular exam Rhythm:Regular Rate:Normal     Neuro/Psych  Headaches, negative psych ROS   GI/Hepatic negative GI ROS, Neg liver ROS,   Endo/Other  negative endocrine ROS  Renal/GU negative Renal ROS  negative genitourinary   Musculoskeletal  (+) Arthritis , Osteoarthritis,  Chronically infected sebaceous cyst posterior neck   Abdominal   Peds  Hematology negative hematology ROS (+)   Anesthesia Other Findings   Reproductive/Obstetrics                             Anesthesia Physical Anesthesia Plan  ASA: II  Anesthesia Plan: MAC   Post-op Pain Management:    Induction: Intravenous  PONV Risk Score and Plan: Ondansetron, Dexamethasone, Midazolam, Metaclopromide and Treatment may vary due to age or medical condition  Airway Management Planned: Oral ETT  Additional Equipment:   Intra-op Plan:   Post-operative Plan: Extubation in OR  Informed Consent: I have reviewed the patients History and Physical, chart, labs and discussed the procedure including the risks, benefits and alternatives for the proposed anesthesia with the patient or authorized representative who has indicated his/her understanding and acceptance.   Dental advisory given  Plan Discussed with: CRNA, Anesthesiologist and Surgeon  Anesthesia Plan Comments:        Anesthesia Quick Evaluation

## 2017-02-14 NOTE — Anesthesia Postprocedure Evaluation (Signed)
Anesthesia Post Note  Patient: Stephanie Henry  Procedure(s) Performed: EXCISION POSTERIOR NECK CYST (N/A Neck)     Patient location during evaluation: PACU Anesthesia Type: MAC Level of consciousness: awake and alert and oriented Pain management: pain level controlled Vital Signs Assessment: post-procedure vital signs reviewed and stable Respiratory status: spontaneous breathing, nonlabored ventilation and respiratory function stable Cardiovascular status: stable and blood pressure returned to baseline Postop Assessment: no apparent nausea or vomiting Anesthetic complications: no    Last Vitals:  Vitals:   02/14/17 0755 02/14/17 0942  BP: (!) 127/56 (!) 119/55  Pulse: 68 73  Resp:  16  Temp: 36.7 C 36.6 C  SpO2: 99% 98%    Last Pain:  Vitals:   02/14/17 0755  TempSrc: Oral                 [name]

## 2017-02-14 NOTE — Discharge Instructions (Signed)
Ok to shower starting tomorrow ° °No vigorous activity for one week ° °Ice pack, tylenol, ibuprofen also for pain °

## 2017-02-14 NOTE — Anesthesia Procedure Notes (Signed)
Procedure Name: MAC Date/Time: 02/14/2017 8:15 AM Performed by: Wilburn Cornelia, CRNA Pre-anesthesia Checklist: Patient identified, Emergency Drugs available, Suction available, Patient being monitored and Timeout performed Patient Re-evaluated:Patient Re-evaluated prior to induction Oxygen Delivery Method: Nasal cannula Placement Confirmation: positive ETCO2 and breath sounds checked- equal and bilateral Dental Injury: Teeth and Oropharynx as per pre-operative assessment

## 2017-02-15 ENCOUNTER — Encounter (HOSPITAL_COMMUNITY): Payer: Self-pay | Admitting: Surgery

## 2017-02-21 DIAGNOSIS — J3089 Other allergic rhinitis: Secondary | ICD-10-CM | POA: Diagnosis not present

## 2017-02-21 DIAGNOSIS — J3081 Allergic rhinitis due to animal (cat) (dog) hair and dander: Secondary | ICD-10-CM | POA: Diagnosis not present

## 2017-02-27 DIAGNOSIS — M1811 Unilateral primary osteoarthritis of first carpometacarpal joint, right hand: Secondary | ICD-10-CM | POA: Diagnosis not present

## 2017-03-09 DIAGNOSIS — J3089 Other allergic rhinitis: Secondary | ICD-10-CM | POA: Diagnosis not present

## 2017-03-09 DIAGNOSIS — J3081 Allergic rhinitis due to animal (cat) (dog) hair and dander: Secondary | ICD-10-CM | POA: Diagnosis not present

## 2017-03-21 DIAGNOSIS — M1811 Unilateral primary osteoarthritis of first carpometacarpal joint, right hand: Secondary | ICD-10-CM | POA: Diagnosis not present

## 2017-04-09 DIAGNOSIS — J3081 Allergic rhinitis due to animal (cat) (dog) hair and dander: Secondary | ICD-10-CM | POA: Diagnosis not present

## 2017-04-09 DIAGNOSIS — J3089 Other allergic rhinitis: Secondary | ICD-10-CM | POA: Diagnosis not present

## 2017-04-20 DIAGNOSIS — J3081 Allergic rhinitis due to animal (cat) (dog) hair and dander: Secondary | ICD-10-CM | POA: Diagnosis not present

## 2017-04-20 DIAGNOSIS — J3089 Other allergic rhinitis: Secondary | ICD-10-CM | POA: Diagnosis not present

## 2017-05-01 DIAGNOSIS — L720 Epidermal cyst: Secondary | ICD-10-CM | POA: Diagnosis not present

## 2017-05-01 DIAGNOSIS — Z85828 Personal history of other malignant neoplasm of skin: Secondary | ICD-10-CM | POA: Diagnosis not present

## 2017-05-01 DIAGNOSIS — L649 Androgenic alopecia, unspecified: Secondary | ICD-10-CM | POA: Diagnosis not present

## 2017-05-01 DIAGNOSIS — L821 Other seborrheic keratosis: Secondary | ICD-10-CM | POA: Diagnosis not present

## 2017-05-10 DIAGNOSIS — J3089 Other allergic rhinitis: Secondary | ICD-10-CM | POA: Diagnosis not present

## 2017-05-10 DIAGNOSIS — J3081 Allergic rhinitis due to animal (cat) (dog) hair and dander: Secondary | ICD-10-CM | POA: Diagnosis not present

## 2017-05-15 DIAGNOSIS — J3081 Allergic rhinitis due to animal (cat) (dog) hair and dander: Secondary | ICD-10-CM | POA: Diagnosis not present

## 2017-05-15 DIAGNOSIS — J3089 Other allergic rhinitis: Secondary | ICD-10-CM | POA: Diagnosis not present

## 2017-06-08 DIAGNOSIS — J3081 Allergic rhinitis due to animal (cat) (dog) hair and dander: Secondary | ICD-10-CM | POA: Diagnosis not present

## 2017-06-08 DIAGNOSIS — J3089 Other allergic rhinitis: Secondary | ICD-10-CM | POA: Diagnosis not present

## 2017-06-12 DIAGNOSIS — M25561 Pain in right knee: Secondary | ICD-10-CM | POA: Diagnosis not present

## 2017-06-21 DIAGNOSIS — J3089 Other allergic rhinitis: Secondary | ICD-10-CM | POA: Diagnosis not present

## 2017-06-21 DIAGNOSIS — J3081 Allergic rhinitis due to animal (cat) (dog) hair and dander: Secondary | ICD-10-CM | POA: Diagnosis not present

## 2017-06-25 DIAGNOSIS — J3089 Other allergic rhinitis: Secondary | ICD-10-CM | POA: Diagnosis not present

## 2017-06-25 DIAGNOSIS — J3081 Allergic rhinitis due to animal (cat) (dog) hair and dander: Secondary | ICD-10-CM | POA: Diagnosis not present

## 2017-07-10 DIAGNOSIS — M25561 Pain in right knee: Secondary | ICD-10-CM | POA: Diagnosis not present

## 2017-07-10 DIAGNOSIS — J3089 Other allergic rhinitis: Secondary | ICD-10-CM | POA: Diagnosis not present

## 2017-07-10 DIAGNOSIS — J3081 Allergic rhinitis due to animal (cat) (dog) hair and dander: Secondary | ICD-10-CM | POA: Diagnosis not present

## 2017-07-26 DIAGNOSIS — J3081 Allergic rhinitis due to animal (cat) (dog) hair and dander: Secondary | ICD-10-CM | POA: Diagnosis not present

## 2017-07-26 DIAGNOSIS — J3089 Other allergic rhinitis: Secondary | ICD-10-CM | POA: Diagnosis not present

## 2017-08-17 DIAGNOSIS — J301 Allergic rhinitis due to pollen: Secondary | ICD-10-CM | POA: Diagnosis not present

## 2017-08-17 DIAGNOSIS — J3089 Other allergic rhinitis: Secondary | ICD-10-CM | POA: Diagnosis not present

## 2017-08-23 DIAGNOSIS — J3081 Allergic rhinitis due to animal (cat) (dog) hair and dander: Secondary | ICD-10-CM | POA: Diagnosis not present

## 2017-08-23 DIAGNOSIS — J3089 Other allergic rhinitis: Secondary | ICD-10-CM | POA: Diagnosis not present

## 2017-08-31 DIAGNOSIS — J3081 Allergic rhinitis due to animal (cat) (dog) hair and dander: Secondary | ICD-10-CM | POA: Diagnosis not present

## 2017-08-31 DIAGNOSIS — J301 Allergic rhinitis due to pollen: Secondary | ICD-10-CM | POA: Diagnosis not present

## 2017-08-31 DIAGNOSIS — J3089 Other allergic rhinitis: Secondary | ICD-10-CM | POA: Diagnosis not present

## 2017-09-07 DIAGNOSIS — J3081 Allergic rhinitis due to animal (cat) (dog) hair and dander: Secondary | ICD-10-CM | POA: Diagnosis not present

## 2017-09-07 DIAGNOSIS — J3089 Other allergic rhinitis: Secondary | ICD-10-CM | POA: Diagnosis not present

## 2017-09-07 DIAGNOSIS — J301 Allergic rhinitis due to pollen: Secondary | ICD-10-CM | POA: Diagnosis not present

## 2017-09-11 DIAGNOSIS — J3081 Allergic rhinitis due to animal (cat) (dog) hair and dander: Secondary | ICD-10-CM | POA: Diagnosis not present

## 2017-09-11 DIAGNOSIS — J3089 Other allergic rhinitis: Secondary | ICD-10-CM | POA: Diagnosis not present

## 2017-09-11 DIAGNOSIS — H40023 Open angle with borderline findings, high risk, bilateral: Secondary | ICD-10-CM | POA: Diagnosis not present

## 2017-09-21 DIAGNOSIS — J3081 Allergic rhinitis due to animal (cat) (dog) hair and dander: Secondary | ICD-10-CM | POA: Diagnosis not present

## 2017-09-21 DIAGNOSIS — J3089 Other allergic rhinitis: Secondary | ICD-10-CM | POA: Diagnosis not present

## 2017-10-01 DIAGNOSIS — J3089 Other allergic rhinitis: Secondary | ICD-10-CM | POA: Diagnosis not present

## 2017-10-01 DIAGNOSIS — J3081 Allergic rhinitis due to animal (cat) (dog) hair and dander: Secondary | ICD-10-CM | POA: Diagnosis not present

## 2017-10-11 DIAGNOSIS — J3089 Other allergic rhinitis: Secondary | ICD-10-CM | POA: Diagnosis not present

## 2017-10-11 DIAGNOSIS — J3081 Allergic rhinitis due to animal (cat) (dog) hair and dander: Secondary | ICD-10-CM | POA: Diagnosis not present

## 2017-10-19 DIAGNOSIS — J3089 Other allergic rhinitis: Secondary | ICD-10-CM | POA: Diagnosis not present

## 2017-10-19 DIAGNOSIS — J3081 Allergic rhinitis due to animal (cat) (dog) hair and dander: Secondary | ICD-10-CM | POA: Diagnosis not present

## 2017-10-24 DIAGNOSIS — J3081 Allergic rhinitis due to animal (cat) (dog) hair and dander: Secondary | ICD-10-CM | POA: Diagnosis not present

## 2017-10-24 DIAGNOSIS — J3089 Other allergic rhinitis: Secondary | ICD-10-CM | POA: Diagnosis not present

## 2017-10-30 DIAGNOSIS — J3081 Allergic rhinitis due to animal (cat) (dog) hair and dander: Secondary | ICD-10-CM | POA: Diagnosis not present

## 2017-10-30 DIAGNOSIS — J301 Allergic rhinitis due to pollen: Secondary | ICD-10-CM | POA: Diagnosis not present

## 2017-11-02 DIAGNOSIS — J3089 Other allergic rhinitis: Secondary | ICD-10-CM | POA: Diagnosis not present

## 2017-11-02 DIAGNOSIS — J3081 Allergic rhinitis due to animal (cat) (dog) hair and dander: Secondary | ICD-10-CM | POA: Diagnosis not present

## 2017-11-07 DIAGNOSIS — J301 Allergic rhinitis due to pollen: Secondary | ICD-10-CM | POA: Diagnosis not present

## 2017-11-07 DIAGNOSIS — J3081 Allergic rhinitis due to animal (cat) (dog) hair and dander: Secondary | ICD-10-CM | POA: Diagnosis not present

## 2017-11-13 DIAGNOSIS — J3081 Allergic rhinitis due to animal (cat) (dog) hair and dander: Secondary | ICD-10-CM | POA: Diagnosis not present

## 2017-11-13 DIAGNOSIS — J3089 Other allergic rhinitis: Secondary | ICD-10-CM | POA: Diagnosis not present

## 2017-11-13 DIAGNOSIS — J019 Acute sinusitis, unspecified: Secondary | ICD-10-CM | POA: Diagnosis not present

## 2017-11-16 DIAGNOSIS — Z1231 Encounter for screening mammogram for malignant neoplasm of breast: Secondary | ICD-10-CM | POA: Diagnosis not present

## 2017-11-16 DIAGNOSIS — J3089 Other allergic rhinitis: Secondary | ICD-10-CM | POA: Diagnosis not present

## 2017-11-16 DIAGNOSIS — Z6826 Body mass index (BMI) 26.0-26.9, adult: Secondary | ICD-10-CM | POA: Diagnosis not present

## 2017-11-16 DIAGNOSIS — Z01419 Encounter for gynecological examination (general) (routine) without abnormal findings: Secondary | ICD-10-CM | POA: Diagnosis not present

## 2017-11-16 DIAGNOSIS — J3081 Allergic rhinitis due to animal (cat) (dog) hair and dander: Secondary | ICD-10-CM | POA: Diagnosis not present

## 2017-11-27 DIAGNOSIS — J3089 Other allergic rhinitis: Secondary | ICD-10-CM | POA: Diagnosis not present

## 2017-11-27 DIAGNOSIS — J3081 Allergic rhinitis due to animal (cat) (dog) hair and dander: Secondary | ICD-10-CM | POA: Diagnosis not present

## 2017-12-06 DIAGNOSIS — J3081 Allergic rhinitis due to animal (cat) (dog) hair and dander: Secondary | ICD-10-CM | POA: Diagnosis not present

## 2017-12-06 DIAGNOSIS — J3089 Other allergic rhinitis: Secondary | ICD-10-CM | POA: Diagnosis not present

## 2017-12-10 NOTE — Progress Notes (Deleted)
HPI:   [name] Henry is a 67 y.o. female, who is here today for her routine physical.  Last CPE: 12/08/16  Regular exercise 3 or more time per week: *** Following a healthy diet: *** She lives with her husband and younger son.  Chronic medical problems: HLD, allergic rhinitis, and hearing loss. She follows with immunologist, received immunotherapy weekly. She see a dermatologist for chronic pruritic rash.   Pap smear ***she follows with her gynecologist periodically.  Immunization History  Administered Date(s) Administered  . Influenza, High Dose Seasonal PF 12/08/2016  . Pneumococcal Conjugate-13 12/08/2016    Mammogram: *** Colonoscopy: 06/2013. DEXA: ***  Hep C screening completed in 12/2016, NR.  She has *** concerns today.   Review of Systems    Current Outpatient Medications on File Prior to Visit  Medication Sig Dispense Refill  . betamethasone dipropionate (DIPROLENE) 0.05 % cream Apply 1 application daily as needed topically (rash on elbow).     . Calcium Carb-Cholecalciferol (CALCIUM + D3) 600-200 MG-UNIT TABS Take 1 tablet by mouth daily.    . Diclofenac Sodium (PENNSAID) 2 % SOLN Place onto the skin.    Marland Kitchen docusate sodium (STOOL SOFTENER) 100 MG capsule Take 100 mg at bedtime by mouth.     . estradiol (ESTRACE) 0.1 MG/GM vaginal cream Place 0.5 g vaginally 2 (two) times a week.    Marland Kitchen ibuprofen (ADVIL,MOTRIN) 200 MG tablet Take 200 mg at bedtime by mouth.     . METRONIDAZOLE, TOPICAL, 0.75 % LOTN APPLY  THIN LAYER TO FACE ONCE DAILY AS NEEDED FOR ROSCEA  2  . oxyCODONE (OXY IR/ROXICODONE) 5 MG immediate release tablet Take 1-2 tablets (5-10 mg total) every 6 (six) hours as needed by mouth for moderate pain or severe pain. 20 tablet 0  . Polyethyl Glycol-Propyl Glycol (SYSTANE FREE OP) Apply 1 drop 2 (two) times daily as needed to eye (dry eyes).    . Probiotic Product (PROBIOTIC PO) Take 1 tablet by mouth daily.     No current facility-administered  medications on file prior to visit.      Past Medical History:  Diagnosis Date  . Allergy   . Arthritis   . Cancer ([place])    basal cell skin cancer    Past Surgical History:  Procedure Laterality Date  . CESAREAN SECTION     x3  . CHOLECYSTECTOMY    . LIPOMA EXCISION N/A 02/14/2017   Procedure: EXCISION POSTERIOR NECK CYST;  Surgeon: Coralie Keens, MD;  Location: [facility];  Service: General;  Laterality: N/A;  . NASAL RECONSTRUCTION    . PARTIAL HYSTERECTOMY    . TONSILLECTOMY      Allergies  Allergen Reactions  . Adhesive [Tape] Other (See Comments)    Rash/ reddness  . Cephalexin Rash    Family History  Problem Relation Age of Onset  . Hypertension Mother   . Heart disease Father        CAD  . Hypertension Father   . Colon cancer Neg Hx     Social History   Socioeconomic History  . Marital status: Married    Spouse name: Not on file  . Number of children: Not on file  . Years of education: Not on file  . Highest education level: Not on file  Occupational History  . Not on file  Social Needs  . Financial resource strain: Not on file  . Food insecurity:    Worry: Not on file  Inability: Not on file  . Transportation needs:    Medical: Not on file    Non-medical: Not on file  Tobacco Use  . Smoking status: Former Research scientist (life sciences)  . Smokeless tobacco: Never Used  . Tobacco comment: quit 30 + years ago  Substance and Sexual Activity  . Alcohol use: No  . Drug use: No  . Sexual activity: Yes    Partners: Male  Lifestyle  . Physical activity:    Days per week: Not on file    Minutes per session: Not on file  . Stress: Not on file  Relationships  . Social connections:    Talks on phone: Not on file    Gets together: Not on file    Attends religious service: Not on file    Active member of club or organization: Not on file    Attends meetings of clubs or organizations: Not on file    Relationship status: Not on file  Other Topics Concern  . Not on  file  Social History Narrative  . Not on file     There were no vitals filed for this visit. There is no height or weight on file to calculate BMI.   Wt Readings from Last 3 Encounters:  02/14/17 154 lb (69.9 kg)  02/12/17 154 lb 8 oz (70.1 kg)  12/30/16 154 lb 1.9 oz (69.9 kg)      Physical Exam    ASSESSMENT AND PLAN:  Stephanie Henry was here today annual physical examination.     No orders of the defined types were placed in this encounter.   There are no diagnoses linked to this encounter.    No problem-specific Assessment & Plan notes found for this encounter.            No follow-ups on file.          Rohan Juenger G. Martinique, MD  [facility]. [place] office.

## 2017-12-11 ENCOUNTER — Encounter: Payer: BLUE CROSS/BLUE SHIELD | Admitting: Family Medicine

## 2017-12-13 DIAGNOSIS — J3089 Other allergic rhinitis: Secondary | ICD-10-CM | POA: Diagnosis not present

## 2017-12-13 DIAGNOSIS — J3081 Allergic rhinitis due to animal (cat) (dog) hair and dander: Secondary | ICD-10-CM | POA: Diagnosis not present

## 2017-12-20 DIAGNOSIS — H40023 Open angle with borderline findings, high risk, bilateral: Secondary | ICD-10-CM | POA: Diagnosis not present

## 2017-12-21 DIAGNOSIS — R05 Cough: Secondary | ICD-10-CM | POA: Diagnosis not present

## 2017-12-21 DIAGNOSIS — J019 Acute sinusitis, unspecified: Secondary | ICD-10-CM | POA: Diagnosis not present

## 2017-12-27 DIAGNOSIS — J3081 Allergic rhinitis due to animal (cat) (dog) hair and dander: Secondary | ICD-10-CM | POA: Diagnosis not present

## 2017-12-27 DIAGNOSIS — J3089 Other allergic rhinitis: Secondary | ICD-10-CM | POA: Diagnosis not present

## 2018-01-01 DIAGNOSIS — J3081 Allergic rhinitis due to animal (cat) (dog) hair and dander: Secondary | ICD-10-CM | POA: Diagnosis not present

## 2018-01-01 DIAGNOSIS — J3089 Other allergic rhinitis: Secondary | ICD-10-CM | POA: Diagnosis not present

## 2018-01-18 ENCOUNTER — Ambulatory Visit: Payer: BLUE CROSS/BLUE SHIELD | Admitting: Adult Health

## 2018-01-18 ENCOUNTER — Ambulatory Visit (INDEPENDENT_AMBULATORY_CARE_PROVIDER_SITE_OTHER): Payer: BLUE CROSS/BLUE SHIELD | Admitting: Family Medicine

## 2018-01-18 ENCOUNTER — Encounter: Payer: Self-pay | Admitting: Family Medicine

## 2018-01-18 ENCOUNTER — Telehealth: Payer: Self-pay | Admitting: *Deleted

## 2018-01-18 VITALS — BP 140/76 | HR 98 | Temp 98.8°F | Resp 12 | Ht 64.5 in | Wt 156.5 lb

## 2018-01-18 DIAGNOSIS — J069 Acute upper respiratory infection, unspecified: Secondary | ICD-10-CM | POA: Diagnosis not present

## 2018-01-18 DIAGNOSIS — R05 Cough: Secondary | ICD-10-CM | POA: Diagnosis not present

## 2018-01-18 DIAGNOSIS — R059 Cough, unspecified: Secondary | ICD-10-CM

## 2018-01-18 DIAGNOSIS — J309 Allergic rhinitis, unspecified: Secondary | ICD-10-CM | POA: Diagnosis not present

## 2018-01-18 MED ORDER — FLUTICASONE PROPIONATE 50 MCG/ACT NA SUSP: 1.0000 | Freq: Two times a day (BID) | NASAL | 0 refills | Status: DC

## 2018-01-18 MED ORDER — PREDNISONE 20 MG PO TABS: 40.0000 mg | ORAL_TABLET | Freq: Every day | ORAL | 0 refills | Status: AC

## 2018-01-18 MED ORDER — BENZONATATE 100 MG PO CAPS: 200.0000 mg | ORAL_CAPSULE | Freq: Two times a day (BID) | ORAL | 0 refills | Status: AC | PRN

## 2018-01-18 NOTE — Progress Notes (Signed)
ACUTE VISIT  HPI:  Chief Complaint  Patient presents with  . Cough    sx started on Tuesday  . Nasal Congestion    [name] is a 67 y.o.female here today complaining of 4 days of respiratory symptoms. Gradual onset. "Scratchy" throat and non productive cough. Sinus pressure.  She has not identified exacerbating or alleviating factors.  Treated for sinusitis with Augmentin about a month ago.  She just completed Clindamycin treatment for root canal. Denies nausea,vomiting,or diarrhea.  She has not had fever or chills.   URI   This is a new problem. The current episode started in the past 7 days. There has been no fever. Associated symptoms include congestion, coughing, rhinorrhea and a sore throat. Pertinent negatives include no abdominal pain, diarrhea, ear pain, headaches, nausea, rash, sneezing, vomiting or wheezing. She has tried antihistamine for the symptoms. The treatment provided mild relief.   No Hx of recent travel. No sick contact. No known insect bite.  Hx of allergies: Yes  OTC medications for this problem: Ibuprofen,Allegra,and Mucinex.    Review of Systems  Constitutional: Positive for fatigue. Negative for activity change, appetite change and fever.  HENT: Positive for congestion, postnasal drip, rhinorrhea, sinus pressure and sore throat. Negative for ear pain, facial swelling, mouth sores, nosebleeds, sneezing, trouble swallowing and voice change.   Eyes: Negative for discharge, redness and itching.  Respiratory: Positive for cough. Negative for shortness of breath and wheezing.   Gastrointestinal: Negative for abdominal pain, diarrhea, nausea and vomiting.  Musculoskeletal: Negative for gait problem and myalgias.  Skin: Negative for rash.  Allergic/Immunologic: Positive for environmental allergies.  Neurological: Negative for weakness and headaches.  Hematological: Negative for adenopathy. Does not bruise/bleed easily.       Current Outpatient Medications on File Prior to Visit  Medication Sig Dispense Refill  . betamethasone dipropionate (DIPROLENE) 0.05 % cream Apply 1 application daily as needed topically (rash on elbow).     . brompheniramine-pseudoephedrine-DM 30-2-10 MG/5ML syrup TAKE 10 MILLILITERS BY MOUTH AT BEDTIME  0  . Calcium Carb-Cholecalciferol (CALCIUM + D3) 600-200 MG-UNIT TABS Take 1 tablet by mouth daily.    . Diclofenac Sodium (PENNSAID) 2 % SOLN Place onto the skin.    Marland Kitchen docusate sodium (STOOL SOFTENER) 100 MG capsule Take 100 mg at bedtime by mouth.     . EPINEPHrine 0.3 mg/0.3 mL IJ SOAJ injection epinephrine 0.3 mg/0.3 mL injection, auto-injector    . estradiol (ESTRACE) 0.1 MG/GM vaginal cream Place 0.5 g vaginally 2 (two) times a week.    Marland Kitchen ibuprofen (ADVIL,MOTRIN) 200 MG tablet Take 200 mg at bedtime by mouth.     . METRONIDAZOLE, TOPICAL, 0.75 % LOTN APPLY  THIN LAYER TO FACE ONCE DAILY AS NEEDED FOR ROSCEA  2  . oxyCODONE (OXY IR/ROXICODONE) 5 MG immediate release tablet Take 1-2 tablets (5-10 mg total) every 6 (six) hours as needed by mouth for moderate pain or severe pain. 20 tablet 0  . Polyethyl Glycol-Propyl Glycol (SYSTANE FREE OP) Apply 1 drop 2 (two) times daily as needed to eye (dry eyes).    . Probiotic Product (PROBIOTIC PO) Take 1 tablet by mouth daily.     No current facility-administered medications on file prior to visit.      Past Medical History:  Diagnosis Date  . Allergy   . Arthritis   . Cancer ([place])    basal cell skin cancer   Allergies  Allergen Reactions  .  Cat Hair Extract Itching  . Cephalexin Rash  . Adhesive [Tape] Other (See Comments)    Rash/ reddness    Social History   Socioeconomic History  . Marital status: Married    Spouse name: Not on file  . Number of children: Not on file  . Years of education: Not on file  . Highest education level: Not on file  Occupational History  . Not on file  Social Needs  . Financial resource  strain: Not on file  . Food insecurity:    Worry: Not on file    Inability: Not on file  . Transportation needs:    Medical: Not on file    Non-medical: Not on file  Tobacco Use  . Smoking status: Former Research scientist (life sciences)  . Smokeless tobacco: Never Used  . Tobacco comment: quit 30 + years ago  Substance and Sexual Activity  . Alcohol use: No  . Drug use: No  . Sexual activity: Yes    Partners: Male  Lifestyle  . Physical activity:    Days per week: Not on file    Minutes per session: Not on file  . Stress: Not on file  Relationships  . Social connections:    Talks on phone: Not on file    Gets together: Not on file    Attends religious service: Not on file    Active member of club or organization: Not on file    Attends meetings of clubs or organizations: Not on file    Relationship status: Not on file  Other Topics Concern  . Not on file  Social History Narrative  . Not on file    Vitals:   01/18/18 1109  BP: 140/76  Pulse: 98  Resp: 12  Temp: 98.8 F (37.1 C)  SpO2: 98%   Body mass index is 26.45 kg/m.   Physical Exam  Nursing note and vitals reviewed. Constitutional: She is oriented to person, place, and time. She appears well-developed and well-nourished. She does not appear ill. No distress.  HENT:  Head: Normocephalic and atraumatic.  Right Ear: External ear and ear canal normal. A middle ear effusion is present.  Left Ear: Tympanic membrane, external ear and ear canal normal.  Nose: Rhinorrhea present. Right sinus exhibits no maxillary sinus tenderness and no frontal sinus tenderness. Left sinus exhibits no maxillary sinus tenderness and no frontal sinus tenderness.  Mouth/Throat: Oropharynx is clear and moist and mucous membranes are normal.  Nasal voice. Hypertrophic turbinates. Mouth breathing. Copious postnasal white drainage.  Eyes: Conjunctivae are normal.  Cardiovascular: Normal rate and regular rhythm.  No murmur heard. Respiratory: Effort normal  and breath sounds normal. No respiratory distress.  Lymphadenopathy:       Head (right side): No submandibular adenopathy present.       Head (left side): No submandibular adenopathy present.    She has no cervical adenopathy.  Neurological: She is alert and oriented to person, place, and time. She has normal strength.  Skin: Skin is warm. No rash noted. No erythema.  Psychiatric: Her mood appears anxious.  Well groomed, good eye contact.      ASSESSMENT AND PLAN:   Ms. Glenora was seen today for cough and nasal congestion.  Diagnoses and all orders for this visit:  URI, acute Symptoms suggests a viral etiology, I explained that symptomatic treatment is usually recommended in this case, so I do not think more abx is needed at this time. Instructed to monitor for signs of complications, including  new onset of fever among some, clearly instructed about warning signs. I also explained that cough and nasal congestion can last a few days and sometimes weeks. F/U as needed.   Allergic rhinitis, unspecified seasonality, unspecified trigger  Flonase nasal spray daily prn. Saline nasal irrigations several times per day. Short course of Prednisone recommended, side effects discussed.  -     fluticasone (FLONASE) 50 MCG/ACT nasal spray; Place 1 spray into both nostrils 2 (two) times daily. -     predniSONE (DELTASONE) 20 MG tablet; Take 2 tablets (40 mg total) by mouth daily with breakfast for 3 days.  Cough -     benzonatate (TESSALON) 100 MG capsule; Take 2 capsules (200 mg total) by mouth 2 (two) times daily as needed for up to 10 days.     Jerauld Bostwick G. Martinique, MD  [facility]. [place] office.

## 2018-01-18 NOTE — Patient Instructions (Signed)
A few things to remember from today's visit:   URI, acute  Allergic rhinitis, unspecified seasonality, unspecified trigger - Plan: fluticasone (FLONASE) 50 MCG/ACT nasal spray, predniSONE (DELTASONE) 20 MG tablet  Cough - Plan: benzonatate (TESSALON) 100 MG capsule  viral infections are self-limited and we treat each symptom depending of severity.  Over the counter medications as decongestants and cold medications usually help, they need to be taken with caution if there is a history of high blood pressure or palpitations.  Tylenol and/or Ibuprofen also helps with most symptoms (headache, muscle aching, fever,etc) Plenty of fluids. Honey helps with cough. Steam inhalations helps with runny nose, nasal congestion, and may prevent sinus infections. Cough and nasal congestion could last a few days and sometimes weeks. Please follow in not any better in 1-2 weeks or if symptoms get worse.   Nasal irrigation with saline several times per day.  Please be sure medication list is accurate. If a new problem present, please set up appointment sooner than planned today.

## 2018-01-18 NOTE — Telephone Encounter (Signed)
Copied from [place] [id number]. Topic: General - Other >> Jan 18, 2018  1:01 PM Selinda Flavin B, [place] wrote: Reason for CRM: Patient calling and states that the pharmacy states they have not received any medication that Dr Martinique had sent over today. Please advise. [place], [place] [address]

## 2018-01-19 ENCOUNTER — Encounter: Payer: Self-pay | Admitting: Family Medicine

## 2018-01-21 NOTE — Telephone Encounter (Signed)
Rx resent.

## 2018-01-25 DIAGNOSIS — J3081 Allergic rhinitis due to animal (cat) (dog) hair and dander: Secondary | ICD-10-CM | POA: Diagnosis not present

## 2018-01-25 DIAGNOSIS — J3089 Other allergic rhinitis: Secondary | ICD-10-CM | POA: Diagnosis not present

## 2018-02-06 DIAGNOSIS — J3089 Other allergic rhinitis: Secondary | ICD-10-CM | POA: Diagnosis not present

## 2018-02-06 DIAGNOSIS — J301 Allergic rhinitis due to pollen: Secondary | ICD-10-CM | POA: Diagnosis not present

## 2018-02-14 DIAGNOSIS — J3089 Other allergic rhinitis: Secondary | ICD-10-CM | POA: Diagnosis not present

## 2018-02-14 DIAGNOSIS — J3081 Allergic rhinitis due to animal (cat) (dog) hair and dander: Secondary | ICD-10-CM | POA: Diagnosis not present

## 2018-02-21 DIAGNOSIS — J3081 Allergic rhinitis due to animal (cat) (dog) hair and dander: Secondary | ICD-10-CM | POA: Diagnosis not present

## 2018-02-21 DIAGNOSIS — J3089 Other allergic rhinitis: Secondary | ICD-10-CM | POA: Diagnosis not present

## 2018-03-06 DIAGNOSIS — J3081 Allergic rhinitis due to animal (cat) (dog) hair and dander: Secondary | ICD-10-CM | POA: Diagnosis not present

## 2018-03-06 DIAGNOSIS — J3089 Other allergic rhinitis: Secondary | ICD-10-CM | POA: Diagnosis not present

## 2018-03-15 DIAGNOSIS — J3089 Other allergic rhinitis: Secondary | ICD-10-CM | POA: Diagnosis not present

## 2018-03-15 DIAGNOSIS — J3081 Allergic rhinitis due to animal (cat) (dog) hair and dander: Secondary | ICD-10-CM | POA: Diagnosis not present

## 2018-03-19 DIAGNOSIS — J3081 Allergic rhinitis due to animal (cat) (dog) hair and dander: Secondary | ICD-10-CM | POA: Diagnosis not present

## 2018-03-19 DIAGNOSIS — J3089 Other allergic rhinitis: Secondary | ICD-10-CM | POA: Diagnosis not present

## 2018-04-12 DIAGNOSIS — J3089 Other allergic rhinitis: Secondary | ICD-10-CM | POA: Diagnosis not present

## 2018-04-12 DIAGNOSIS — J3081 Allergic rhinitis due to animal (cat) (dog) hair and dander: Secondary | ICD-10-CM | POA: Diagnosis not present

## 2018-04-26 DIAGNOSIS — J3081 Allergic rhinitis due to animal (cat) (dog) hair and dander: Secondary | ICD-10-CM | POA: Diagnosis not present

## 2018-04-26 DIAGNOSIS — J3089 Other allergic rhinitis: Secondary | ICD-10-CM | POA: Diagnosis not present

## 2018-05-02 DIAGNOSIS — J3081 Allergic rhinitis due to animal (cat) (dog) hair and dander: Secondary | ICD-10-CM | POA: Diagnosis not present

## 2018-05-02 DIAGNOSIS — J3089 Other allergic rhinitis: Secondary | ICD-10-CM | POA: Diagnosis not present

## 2018-05-10 DIAGNOSIS — J3081 Allergic rhinitis due to animal (cat) (dog) hair and dander: Secondary | ICD-10-CM | POA: Diagnosis not present

## 2018-05-10 DIAGNOSIS — J3089 Other allergic rhinitis: Secondary | ICD-10-CM | POA: Diagnosis not present

## 2018-05-23 DIAGNOSIS — J3089 Other allergic rhinitis: Secondary | ICD-10-CM | POA: Diagnosis not present

## 2018-05-23 DIAGNOSIS — J3081 Allergic rhinitis due to animal (cat) (dog) hair and dander: Secondary | ICD-10-CM | POA: Diagnosis not present

## 2018-06-05 DIAGNOSIS — Z85828 Personal history of other malignant neoplasm of skin: Secondary | ICD-10-CM | POA: Diagnosis not present

## 2018-06-05 DIAGNOSIS — L821 Other seborrheic keratosis: Secondary | ICD-10-CM | POA: Diagnosis not present

## 2018-06-05 DIAGNOSIS — D2262 Melanocytic nevi of left upper limb, including shoulder: Secondary | ICD-10-CM | POA: Diagnosis not present

## 2018-06-05 DIAGNOSIS — L718 Other rosacea: Secondary | ICD-10-CM | POA: Diagnosis not present

## 2018-06-12 DIAGNOSIS — J3089 Other allergic rhinitis: Secondary | ICD-10-CM | POA: Diagnosis not present

## 2018-06-12 DIAGNOSIS — J3081 Allergic rhinitis due to animal (cat) (dog) hair and dander: Secondary | ICD-10-CM | POA: Diagnosis not present

## 2018-06-19 DIAGNOSIS — J3089 Other allergic rhinitis: Secondary | ICD-10-CM | POA: Diagnosis not present

## 2018-06-19 DIAGNOSIS — J3081 Allergic rhinitis due to animal (cat) (dog) hair and dander: Secondary | ICD-10-CM | POA: Diagnosis not present

## 2018-06-28 DIAGNOSIS — J3081 Allergic rhinitis due to animal (cat) (dog) hair and dander: Secondary | ICD-10-CM | POA: Diagnosis not present

## 2018-06-28 DIAGNOSIS — J3089 Other allergic rhinitis: Secondary | ICD-10-CM | POA: Diagnosis not present

## 2018-06-28 DIAGNOSIS — J301 Allergic rhinitis due to pollen: Secondary | ICD-10-CM | POA: Diagnosis not present

## 2018-07-04 DIAGNOSIS — J3081 Allergic rhinitis due to animal (cat) (dog) hair and dander: Secondary | ICD-10-CM | POA: Diagnosis not present

## 2018-07-04 DIAGNOSIS — J3089 Other allergic rhinitis: Secondary | ICD-10-CM | POA: Diagnosis not present

## 2018-07-09 DIAGNOSIS — J3081 Allergic rhinitis due to animal (cat) (dog) hair and dander: Secondary | ICD-10-CM | POA: Diagnosis not present

## 2018-07-09 DIAGNOSIS — J3089 Other allergic rhinitis: Secondary | ICD-10-CM | POA: Diagnosis not present

## 2018-07-16 DIAGNOSIS — J301 Allergic rhinitis due to pollen: Secondary | ICD-10-CM | POA: Diagnosis not present

## 2018-07-16 DIAGNOSIS — J3081 Allergic rhinitis due to animal (cat) (dog) hair and dander: Secondary | ICD-10-CM | POA: Diagnosis not present

## 2018-07-16 DIAGNOSIS — J3089 Other allergic rhinitis: Secondary | ICD-10-CM | POA: Diagnosis not present

## 2018-07-26 DIAGNOSIS — J3089 Other allergic rhinitis: Secondary | ICD-10-CM | POA: Diagnosis not present

## 2018-07-26 DIAGNOSIS — J3081 Allergic rhinitis due to animal (cat) (dog) hair and dander: Secondary | ICD-10-CM | POA: Diagnosis not present

## 2018-08-09 DIAGNOSIS — J3081 Allergic rhinitis due to animal (cat) (dog) hair and dander: Secondary | ICD-10-CM | POA: Diagnosis not present

## 2018-08-09 DIAGNOSIS — J3089 Other allergic rhinitis: Secondary | ICD-10-CM | POA: Diagnosis not present

## 2018-08-16 DIAGNOSIS — J3081 Allergic rhinitis due to animal (cat) (dog) hair and dander: Secondary | ICD-10-CM | POA: Diagnosis not present

## 2018-08-16 DIAGNOSIS — J3089 Other allergic rhinitis: Secondary | ICD-10-CM | POA: Diagnosis not present

## 2018-08-22 DIAGNOSIS — J3089 Other allergic rhinitis: Secondary | ICD-10-CM | POA: Diagnosis not present

## 2018-08-22 DIAGNOSIS — J3081 Allergic rhinitis due to animal (cat) (dog) hair and dander: Secondary | ICD-10-CM | POA: Diagnosis not present

## 2018-08-30 DIAGNOSIS — J3081 Allergic rhinitis due to animal (cat) (dog) hair and dander: Secondary | ICD-10-CM | POA: Diagnosis not present

## 2018-09-13 DIAGNOSIS — J3081 Allergic rhinitis due to animal (cat) (dog) hair and dander: Secondary | ICD-10-CM | POA: Diagnosis not present

## 2018-09-13 DIAGNOSIS — J3089 Other allergic rhinitis: Secondary | ICD-10-CM | POA: Diagnosis not present

## 2018-09-20 DIAGNOSIS — J3081 Allergic rhinitis due to animal (cat) (dog) hair and dander: Secondary | ICD-10-CM | POA: Diagnosis not present

## 2018-09-20 DIAGNOSIS — J3089 Other allergic rhinitis: Secondary | ICD-10-CM | POA: Diagnosis not present

## 2018-09-25 DIAGNOSIS — J3089 Other allergic rhinitis: Secondary | ICD-10-CM | POA: Diagnosis not present

## 2018-09-25 DIAGNOSIS — J3081 Allergic rhinitis due to animal (cat) (dog) hair and dander: Secondary | ICD-10-CM | POA: Diagnosis not present

## 2018-10-08 DIAGNOSIS — J3081 Allergic rhinitis due to animal (cat) (dog) hair and dander: Secondary | ICD-10-CM | POA: Diagnosis not present

## 2018-10-08 DIAGNOSIS — J3089 Other allergic rhinitis: Secondary | ICD-10-CM | POA: Diagnosis not present

## 2018-10-08 DIAGNOSIS — R438 Other disturbances of smell and taste: Secondary | ICD-10-CM | POA: Diagnosis not present

## 2018-10-18 ENCOUNTER — Other Ambulatory Visit: Payer: Self-pay

## 2018-10-18 ENCOUNTER — Encounter: Payer: Self-pay | Admitting: Family Medicine

## 2018-10-18 ENCOUNTER — Ambulatory Visit (INDEPENDENT_AMBULATORY_CARE_PROVIDER_SITE_OTHER): Payer: BC Managed Care – PPO | Admitting: Family Medicine

## 2018-10-18 VITALS — BP 122/80 | HR 68 | Temp 98.2°F | Resp 12 | Ht 64.5 in | Wt 154.2 lb

## 2018-10-18 DIAGNOSIS — Z23 Encounter for immunization: Secondary | ICD-10-CM | POA: Diagnosis not present

## 2018-10-18 DIAGNOSIS — Z0000 Encounter for general adult medical examination without abnormal findings: Secondary | ICD-10-CM | POA: Diagnosis not present

## 2018-10-18 DIAGNOSIS — J3089 Other allergic rhinitis: Secondary | ICD-10-CM | POA: Diagnosis not present

## 2018-10-18 DIAGNOSIS — J3081 Allergic rhinitis due to animal (cat) (dog) hair and dander: Secondary | ICD-10-CM | POA: Diagnosis not present

## 2018-10-18 DIAGNOSIS — E049 Nontoxic goiter, unspecified: Secondary | ICD-10-CM | POA: Diagnosis not present

## 2018-10-18 DIAGNOSIS — E785 Hyperlipidemia, unspecified: Secondary | ICD-10-CM

## 2018-10-18 LAB — LIPID PANEL
Cholesterol: 222 mg/dL — ABNORMAL HIGH (ref 0–200)
HDL: 45.7 mg/dL (ref >39.00)
LDL Cholesterol: 150 mg/dL — ABNORMAL HIGH (ref 0–99)
NonHDL: 175.81
Total CHOL/HDL Ratio: 5
Triglycerides: 127 mg/dL (ref 0.0–149.0)
VLDL: 25.4 mg/dL (ref 0.0–40.0)

## 2018-10-18 LAB — BASIC METABOLIC PANEL
BUN: 14 mg/dL (ref 6–23)
CO2: 29 mEq/L (ref 19–32)
Calcium: 9.8 mg/dL (ref 8.4–10.5)
Chloride: 103 mEq/L (ref 96–112)
Creatinine, Ser: 0.68 mg/dL (ref 0.40–1.20)
GFR: 86.3 mL/min (ref >60.00)
Glucose, Bld: 88 mg/dL (ref 70–99)
Potassium: 4.1 mEq/L (ref 3.5–5.1)
Sodium: 141 mEq/L (ref 135–145)

## 2018-10-18 LAB — TSH: TSH: 1.19 u[IU]/mL (ref 0.35–4.50)

## 2018-10-18 NOTE — Progress Notes (Signed)
HPI:   Stephanie Henry is a 68 y.o. female, who is here today for her routine physical.  Last CPE: 12/08/2016  Regular exercise 3 or more time per week: She walks the treadmill 3 times per week and does all her yard work. Following a healthy diet: Yes She lives with her husband and her son.  Chronic medical problems: Allergic rhinitis,HLD,and hearing loss among some.  Pap smear 10/2017, she sees her gyn annually.  Immunization History  Administered Date(s) Administered  . Influenza, High Dose Seasonal PF 12/08/2016, 04/27/2018  . Influenza-Unspecified 02/21/2017  . Pneumococcal Conjugate-13 12/08/2016  . Pneumococcal Polysaccharide-23 10/18/2018    Mammogram: 10/2017 Colonoscopy: 06/2013 DEXA: 10/2016 at her gyn's office.  Hep C screening: 12/2016 NR  She has no concerns today.  Allergic rhinitis,having some nasal congestion and rhinorrhea + mild changes in her taste.States that she has same symptoms annually and around this time of the year. No fever,chills,fatigue,body aches,or cough. No sick contact.  HLD,she is on non pharmacologic treatment.  Review of Systems  Constitutional: Negative for appetite change, chills, fatigue and fever.  HENT: Positive for hearing loss (stable). Negative for dental problem, mouth sores, sore throat, trouble swallowing and voice change.   Eyes: Negative for redness and visual disturbance.  Respiratory: Negative for cough, shortness of breath and wheezing.   Cardiovascular: Negative for chest pain and leg swelling.  Gastrointestinal: Negative for abdominal pain, nausea and vomiting.       No changes in bowel habits.  Endocrine: Negative for cold intolerance, heat intolerance, polydipsia, polyphagia and polyuria.  Genitourinary: Negative for decreased urine volume, dysuria, hematuria and vaginal bleeding.  Musculoskeletal: Positive for arthralgias. Negative for gait problem, joint swelling and myalgias.  Skin: Negative for color  change and rash.  Allergic/Immunologic: Positive for environmental allergies.  Neurological: Negative for syncope, weakness and headaches.  Hematological: Negative for adenopathy. Does not bruise/bleed easily.  Psychiatric/Behavioral: Negative for confusion and sleep disturbance. The patient is not nervous/anxious.   All other systems reviewed and are negative.   Current Outpatient Medications on File Prior to Visit  Medication Sig Dispense Refill  . azelastine (ASTELIN) 0.1 % nasal spray U 1 TO 2 SPRAYS IEN BID PRF BREAKTHROUGH SYMPTOMS    . betamethasone dipropionate (DIPROLENE) 0.05 % cream Apply 1 application daily as needed topically (rash on elbow).     . brompheniramine-pseudoephedrine-DM 30-2-10 MG/5ML syrup TAKE 10 MILLILITERS BY MOUTH AT BEDTIME  0  . Calcium Carb-Cholecalciferol (CALCIUM + D3) 600-200 MG-UNIT TABS Take 1 tablet by mouth daily.    . Diclofenac Sodium (PENNSAID) 2 % SOLN Place onto the skin.    Marland Kitchen docusate sodium (STOOL SOFTENER) 100 MG capsule Take 100 mg at bedtime by mouth.     . EPINEPHrine 0.3 mg/0.3 mL IJ SOAJ injection epinephrine 0.3 mg/0.3 mL injection, auto-injector    . estradiol (ESTRACE) 0.1 MG/GM vaginal cream Place 0.5 g vaginally 2 (two) times a week.    . fluticasone (FLONASE) 50 MCG/ACT nasal spray Place 1 spray into both nostrils 2 (two) times daily. 16 g 0  . ibuprofen (ADVIL,MOTRIN) 200 MG tablet Take 200 mg at bedtime by mouth.     . METRONIDAZOLE, TOPICAL, 0.75 % LOTN APPLY  THIN LAYER TO FACE ONCE DAILY AS NEEDED FOR ROSCEA  2  . Olopatadine HCl 0.6 % SOLN     . oxyCODONE (OXY IR/ROXICODONE) 5 MG immediate release tablet Take 1-2 tablets (5-10 mg total) every 6 (six) hours as  needed by mouth for moderate pain or severe pain. 20 tablet 0  . Polyethyl Glycol-Propyl Glycol (SYSTANE FREE OP) Apply 1 drop 2 (two) times daily as needed to eye (dry eyes).    Marland Kitchen PREVIDENT 5000 BOOSTER PLUS 1.1 % PSTE     . Probiotic Product (PROBIOTIC PO) Take 1 tablet  by mouth daily.     No current facility-administered medications on file prior to visit.      Past Medical History:  Diagnosis Date  . Allergy   . Arthritis   . Cancer ([place])    basal cell skin cancer    Past Surgical History:  Procedure Laterality Date  . CESAREAN SECTION     x3  . CHOLECYSTECTOMY    . LIPOMA EXCISION N/A 02/14/2017   Procedure: EXCISION POSTERIOR NECK CYST;  Surgeon: Coralie Keens, MD;  Location: [place];  Service: General;  Laterality: N/A;  . NASAL RECONSTRUCTION    . PARTIAL HYSTERECTOMY    . TONSILLECTOMY      Allergies  Allergen Reactions  . Cat Hair Extract Itching  . Other Itching  . Cephalexin Rash  . Adhesive [Tape] Other (See Comments)    Rash/ reddness    Family History  Problem Relation Age of Onset  . Hypertension Mother   . Heart disease Father        CAD  . Hypertension Father   . Colon cancer Neg Hx     Social History   Socioeconomic History  . Marital status: Married    Spouse name: Not on file  . Number of children: Not on file  . Years of education: Not on file  . Highest education level: Not on file  Occupational History  . Not on file  Social Needs  . Financial resource strain: Not on file  . Food insecurity    Worry: Not on file    Inability: Not on file  . Transportation needs    Medical: Not on file    Non-medical: Not on file  Tobacco Use  . Smoking status: Former Research scientist (life sciences)  . Smokeless tobacco: Never Used  . Tobacco comment: quit 30 + years ago  Substance and Sexual Activity  . Alcohol use: No  . Drug use: No  . Sexual activity: Yes    Partners: Male  Lifestyle  . Physical activity    Days per week: Not on file    Minutes per session: Not on file  . Stress: Not on file  Relationships  . Social Herbalist on phone: Not on file    Gets together: Not on file    Attends religious service: Not on file    Active member of club or organization: Not on file    Attends meetings of clubs or  organizations: Not on file    Relationship status: Not on file  Other Topics Concern  . Not on file  Social History Narrative  . Not on file    Vitals:   10/18/18 1407  BP: 122/80  Pulse: 68  Resp: 12  Temp: 98.2 F (36.8 C)  SpO2: 97%   Body mass index is 26.45 kg/m.   Wt Readings from Last 3 Encounters:  01/18/18 156 lb 8 oz (71 kg)  02/14/17 154 lb (69.9 kg)  02/12/17 154 lb 8 oz (70.1 kg)    Physical Exam  Nursing note and vitals reviewed. Constitutional: She is oriented to person, place, and time. She appears well-developed and well-nourished. No distress.  HENT:  Head: Normocephalic and atraumatic.  Right Ear: Hearing, tympanic membrane, external ear and ear canal normal.  Left Ear: Hearing, tympanic membrane, external ear and ear canal normal.  Mouth/Throat: Uvula is midline, oropharynx is Henry and moist and mucous membranes are normal.  Eyes: Pupils are equal, round, and reactive to light. Conjunctivae and EOM are normal.  Neck: No tracheal deviation present. Thyromegaly (? Thyroid nodule,L) present.  Cardiovascular: Normal rate and regular rhythm.  No murmur heard. Pulses:      Dorsalis pedis pulses are 2+ on the right side and 2+ on the left side.  Respiratory: Effort normal and breath sounds normal. No respiratory distress.  GI: Soft. She exhibits no mass. There is no hepatomegaly. There is no abdominal tenderness.  Genitourinary:    Genitourinary Comments: Deferred to gyn.   Musculoskeletal:        General: No edema.     Comments: No major deformity or signs of synovitis appreciated.  Lymphadenopathy:    She has no cervical adenopathy.       Right: No supraclavicular adenopathy present.       Left: No supraclavicular adenopathy present.  Neurological: She is alert and oriented to person, place, and time. She has normal strength. No cranial nerve deficit. Coordination and gait normal.  Reflex Scores:      Bicep reflexes are 2+ on the right side and 2+  on the left side.      Patellar reflexes are 2+ on the right side and 2+ on the left side. Skin: Skin is warm. No rash noted. No erythema.  Psychiatric: She has a normal mood and affect. Cognition and memory are normal.  Well groomed, good eye contact.    ASSESSMENT AND PLAN:  Ms. Symphoni Helbling Kantner was here today annual physical examination.   Orders Placed This Encounter  Procedures  . US THYROID  . Pneumococcal polysaccharide vaccine 23-valent greater than or equal to 2yo subcutaneous/IM  . Basic metabolic panel  . Lipid panel  . TSH   Lab Results  Component Value Date   TSH 1.19 10/18/2018   Lab Results  Component Value Date   CREATININE 0.68 10/18/2018   BUN 14 10/18/2018   NA 141 10/18/2018   K 4.1 10/18/2018   CL 103 10/18/2018   CO2 29 10/18/2018   Lab Results  Component Value Date   CHOL 222 (H) 10/18/2018   HDL 45.70 10/18/2018   LDLCALC 150 (H) 10/18/2018   TRIG 127.0 10/18/2018   CHOLHDL 5 10/18/2018    Routine general medical examination at a health care facility We discussed the importance of regular physical activity and healthy diet for prevention of chronic illness and/or complications. Preventive guidelines reviewed. Vaccination updated. Continue gyn preventive care with gyn. Ca++ and vit D supplementation recommended. Next CPE in a year.  The 10-year ASCVD risk score Mikey Bussing DC Brooke Bonito., et al., 2013) is: 7%   Values used to calculate the score:     Age: 24 years     Sex: Female     Is Non-Hispanic African American: No     Diabetic: No     Tobacco smoker: No     Systolic Blood Pressure: 161 mmHg     Is BP treated: No     HDL Cholesterol: 45.7 mg/dL     Total Cholesterol: 222 mg/dL  Enlarged thyroid gland ? Left thyroid nodule. Further recommendations will be given according to labs/imaging results.  Need for pneumococcal vaccination -  Pneumococcal polysaccharide vaccine 23-valent greater than or equal to 2yo subcutaneous/IM    Hyperlipidemia Continue non pharmacologic treatment. Further recommendations will be given according to lipid panel results.    Return in 1 year (on 10/18/2019) for cpe.   Mandalyn Pasqua G. Martinique, MD  [facility]. [place] office.

## 2018-10-18 NOTE — Patient Instructions (Signed)
A few things to remember from today's visit:   Routine general medical examination at a health care facility  Hyperlipidemia, unspecified hyperlipidemia type - Plan: Basic metabolic panel, Lipid panel  Enlarged thyroid gland - Plan: TSH, US THYROID  Today you have you routine preventive visit.  At least 150 minutes of moderate exercise per week, daily brisk walking for 15-30 min is a good exercise option. Healthy diet low in saturated (animal) fats and sweets and consisting of fresh fruits and vegetables, lean meats such as fish and white chicken and whole grains.  These are some of recommendations for screening depending of age and risk factors:   - Vaccines:  Tdap vaccine every 10 years.  Shingles vaccine recommended at age 75, could be given after 67 years of age but not sure about insurance coverage.   Pneumonia vaccines:  Prevnar 13 at 65 and Pneumovax at 48. Sometimes Pneumovax is giving earlier if history of smoking, lung disease,diabetes,kidney disease among some.   Screening for diabetes at age 61 and every 3 years.  -Breast cancer: Mammogram: There is disagreement between experts about when to start screening in low risk asymptomatic female but recent recommendations are to start screening at 1 and not later than 67 years old , every 1-2 years and after 67 yo q 2 years. Screening is recommended until 67 years old but some women can continue screening depending of healthy issues.   Colon cancer screening: starts at 67 years old until 67 years old.  Also recommended:  1. Dental visit- Brush and floss your teeth twice daily; visit your dentist twice a year. 2. Eye doctor- Get an eye exam at least every 2 years. 3. Helmet use- Always wear a helmet when riding a bicycle, motorcycle, rollerblading or skateboarding. 4. Safe sex- If you may be exposed to sexually transmitted infections, use a condom. 5. Seat belts- Seat belts can save your live; always wear  one. 6. Smoke/Carbon Monoxide detectors- These detectors need to be installed on the appropriate level of your home. Replace batteries at least once a year. 7. Skin cancer- When out in the sun please cover up and use sunscreen 15 SPF or higher. 8. Violence- If anyone is threatening or hurting you, please tell your healthcare provider.  9. Drink alcohol in moderation- Limit alcohol intake to one drink or less per day. Never drink and drive.  Please be sure medication list is accurate. If a new problem present, please set up appointment sooner than planned today.

## 2018-10-18 NOTE — Assessment & Plan Note (Signed)
Continue non pharmacologic treatment. Further recommendations will be given according to lipid panel results.

## 2018-10-22 ENCOUNTER — Encounter: Payer: Self-pay | Admitting: Family Medicine

## 2018-10-25 DIAGNOSIS — J3081 Allergic rhinitis due to animal (cat) (dog) hair and dander: Secondary | ICD-10-CM | POA: Diagnosis not present

## 2018-10-25 DIAGNOSIS — J3089 Other allergic rhinitis: Secondary | ICD-10-CM | POA: Diagnosis not present

## 2018-10-31 ENCOUNTER — Ambulatory Visit
Admission: RE | Admit: 2018-10-31 | Discharge: 2018-10-31 | Disposition: A | Discharge status: Home / Self Care | Payer: BC Managed Care – PPO | Source: Ambulatory Visit | Attending: Family Medicine | Admitting: Family Medicine

## 2018-10-31 DIAGNOSIS — E049 Nontoxic goiter, unspecified: Secondary | ICD-10-CM

## 2018-10-31 DIAGNOSIS — E041 Nontoxic single thyroid nodule: Secondary | ICD-10-CM | POA: Diagnosis not present

## 2018-11-01 DIAGNOSIS — J3081 Allergic rhinitis due to animal (cat) (dog) hair and dander: Secondary | ICD-10-CM | POA: Diagnosis not present

## 2018-11-01 DIAGNOSIS — J3089 Other allergic rhinitis: Secondary | ICD-10-CM | POA: Diagnosis not present

## 2018-11-03 ENCOUNTER — Encounter: Payer: Self-pay | Admitting: Family Medicine

## 2018-11-20 DIAGNOSIS — Z01419 Encounter for gynecological examination (general) (routine) without abnormal findings: Secondary | ICD-10-CM | POA: Diagnosis not present

## 2018-11-20 DIAGNOSIS — Z6826 Body mass index (BMI) 26.0-26.9, adult: Secondary | ICD-10-CM | POA: Diagnosis not present

## 2018-11-20 DIAGNOSIS — Z1231 Encounter for screening mammogram for malignant neoplasm of breast: Secondary | ICD-10-CM | POA: Diagnosis not present

## 2018-11-20 DIAGNOSIS — Z13820 Encounter for screening for osteoporosis: Secondary | ICD-10-CM | POA: Diagnosis not present

## 2018-11-20 DIAGNOSIS — N952 Postmenopausal atrophic vaginitis: Secondary | ICD-10-CM | POA: Diagnosis not present

## 2018-11-21 DIAGNOSIS — J3081 Allergic rhinitis due to animal (cat) (dog) hair and dander: Secondary | ICD-10-CM | POA: Diagnosis not present

## 2018-11-21 DIAGNOSIS — J3089 Other allergic rhinitis: Secondary | ICD-10-CM | POA: Diagnosis not present

## 2018-12-06 DIAGNOSIS — J3081 Allergic rhinitis due to animal (cat) (dog) hair and dander: Secondary | ICD-10-CM | POA: Diagnosis not present

## 2018-12-06 DIAGNOSIS — J3089 Other allergic rhinitis: Secondary | ICD-10-CM | POA: Diagnosis not present

## 2018-12-10 DIAGNOSIS — J3089 Other allergic rhinitis: Secondary | ICD-10-CM | POA: Diagnosis not present

## 2018-12-10 DIAGNOSIS — J3081 Allergic rhinitis due to animal (cat) (dog) hair and dander: Secondary | ICD-10-CM | POA: Diagnosis not present

## 2018-12-13 DIAGNOSIS — J3081 Allergic rhinitis due to animal (cat) (dog) hair and dander: Secondary | ICD-10-CM | POA: Diagnosis not present

## 2018-12-13 DIAGNOSIS — J3089 Other allergic rhinitis: Secondary | ICD-10-CM | POA: Diagnosis not present

## 2018-12-19 DIAGNOSIS — J3081 Allergic rhinitis due to animal (cat) (dog) hair and dander: Secondary | ICD-10-CM | POA: Diagnosis not present

## 2018-12-19 DIAGNOSIS — J3089 Other allergic rhinitis: Secondary | ICD-10-CM | POA: Diagnosis not present

## 2018-12-25 DIAGNOSIS — J3081 Allergic rhinitis due to animal (cat) (dog) hair and dander: Secondary | ICD-10-CM | POA: Diagnosis not present

## 2018-12-25 DIAGNOSIS — J3089 Other allergic rhinitis: Secondary | ICD-10-CM | POA: Diagnosis not present

## 2019-01-01 DIAGNOSIS — J3081 Allergic rhinitis due to animal (cat) (dog) hair and dander: Secondary | ICD-10-CM | POA: Diagnosis not present

## 2019-01-01 DIAGNOSIS — J3089 Other allergic rhinitis: Secondary | ICD-10-CM | POA: Diagnosis not present

## 2019-01-07 DIAGNOSIS — J3089 Other allergic rhinitis: Secondary | ICD-10-CM | POA: Diagnosis not present

## 2019-01-07 DIAGNOSIS — J3081 Allergic rhinitis due to animal (cat) (dog) hair and dander: Secondary | ICD-10-CM | POA: Diagnosis not present

## 2019-01-07 DIAGNOSIS — Z23 Encounter for immunization: Secondary | ICD-10-CM | POA: Diagnosis not present

## 2019-01-16 DIAGNOSIS — J3089 Other allergic rhinitis: Secondary | ICD-10-CM | POA: Diagnosis not present

## 2019-01-16 DIAGNOSIS — J3081 Allergic rhinitis due to animal (cat) (dog) hair and dander: Secondary | ICD-10-CM | POA: Diagnosis not present

## 2019-01-22 DIAGNOSIS — J3081 Allergic rhinitis due to animal (cat) (dog) hair and dander: Secondary | ICD-10-CM | POA: Diagnosis not present

## 2019-01-22 DIAGNOSIS — J3089 Other allergic rhinitis: Secondary | ICD-10-CM | POA: Diagnosis not present

## 2019-02-04 DIAGNOSIS — H40023 Open angle with borderline findings, high risk, bilateral: Secondary | ICD-10-CM | POA: Diagnosis not present

## 2019-02-04 DIAGNOSIS — J3081 Allergic rhinitis due to animal (cat) (dog) hair and dander: Secondary | ICD-10-CM | POA: Diagnosis not present

## 2019-02-04 DIAGNOSIS — J3089 Other allergic rhinitis: Secondary | ICD-10-CM | POA: Diagnosis not present

## 2019-02-12 DIAGNOSIS — J3089 Other allergic rhinitis: Secondary | ICD-10-CM | POA: Diagnosis not present

## 2019-02-12 DIAGNOSIS — J3081 Allergic rhinitis due to animal (cat) (dog) hair and dander: Secondary | ICD-10-CM | POA: Diagnosis not present

## 2019-02-17 DIAGNOSIS — J3089 Other allergic rhinitis: Secondary | ICD-10-CM | POA: Diagnosis not present

## 2019-02-17 DIAGNOSIS — J3081 Allergic rhinitis due to animal (cat) (dog) hair and dander: Secondary | ICD-10-CM | POA: Diagnosis not present

## 2019-02-26 DIAGNOSIS — J3089 Other allergic rhinitis: Secondary | ICD-10-CM | POA: Diagnosis not present

## 2019-02-26 DIAGNOSIS — J3081 Allergic rhinitis due to animal (cat) (dog) hair and dander: Secondary | ICD-10-CM | POA: Diagnosis not present

## 2019-03-13 DIAGNOSIS — J3089 Other allergic rhinitis: Secondary | ICD-10-CM | POA: Diagnosis not present

## 2019-03-13 DIAGNOSIS — J3081 Allergic rhinitis due to animal (cat) (dog) hair and dander: Secondary | ICD-10-CM | POA: Diagnosis not present

## 2019-03-26 DIAGNOSIS — J3081 Allergic rhinitis due to animal (cat) (dog) hair and dander: Secondary | ICD-10-CM | POA: Diagnosis not present

## 2019-03-26 DIAGNOSIS — J3089 Other allergic rhinitis: Secondary | ICD-10-CM | POA: Diagnosis not present

## 2019-04-02 DIAGNOSIS — J3081 Allergic rhinitis due to animal (cat) (dog) hair and dander: Secondary | ICD-10-CM | POA: Diagnosis not present

## 2019-04-02 DIAGNOSIS — J3089 Other allergic rhinitis: Secondary | ICD-10-CM | POA: Diagnosis not present

## 2019-04-10 DIAGNOSIS — J3089 Other allergic rhinitis: Secondary | ICD-10-CM | POA: Diagnosis not present

## 2019-04-10 DIAGNOSIS — J3081 Allergic rhinitis due to animal (cat) (dog) hair and dander: Secondary | ICD-10-CM | POA: Diagnosis not present

## 2019-04-16 DIAGNOSIS — J3089 Other allergic rhinitis: Secondary | ICD-10-CM | POA: Diagnosis not present

## 2019-04-16 DIAGNOSIS — J3081 Allergic rhinitis due to animal (cat) (dog) hair and dander: Secondary | ICD-10-CM | POA: Diagnosis not present

## 2019-04-24 DIAGNOSIS — J3081 Allergic rhinitis due to animal (cat) (dog) hair and dander: Secondary | ICD-10-CM | POA: Diagnosis not present

## 2019-04-24 DIAGNOSIS — J3089 Other allergic rhinitis: Secondary | ICD-10-CM | POA: Diagnosis not present

## 2019-04-29 DIAGNOSIS — J3081 Allergic rhinitis due to animal (cat) (dog) hair and dander: Secondary | ICD-10-CM | POA: Diagnosis not present

## 2019-04-29 DIAGNOSIS — R438 Other disturbances of smell and taste: Secondary | ICD-10-CM | POA: Diagnosis not present

## 2019-04-29 DIAGNOSIS — J3089 Other allergic rhinitis: Secondary | ICD-10-CM | POA: Diagnosis not present

## 2019-04-29 DIAGNOSIS — J342 Deviated nasal septum: Secondary | ICD-10-CM | POA: Diagnosis not present

## 2019-05-12 DIAGNOSIS — J3089 Other allergic rhinitis: Secondary | ICD-10-CM | POA: Diagnosis not present

## 2019-05-12 DIAGNOSIS — J3081 Allergic rhinitis due to animal (cat) (dog) hair and dander: Secondary | ICD-10-CM | POA: Diagnosis not present

## 2019-05-21 DIAGNOSIS — J310 Chronic rhinitis: Secondary | ICD-10-CM | POA: Diagnosis not present

## 2019-05-21 DIAGNOSIS — R438 Other disturbances of smell and taste: Secondary | ICD-10-CM | POA: Diagnosis not present

## 2019-05-21 DIAGNOSIS — J342 Deviated nasal septum: Secondary | ICD-10-CM | POA: Diagnosis not present

## 2019-05-21 DIAGNOSIS — J343 Hypertrophy of nasal turbinates: Secondary | ICD-10-CM | POA: Diagnosis not present

## 2019-05-21 DIAGNOSIS — H9313 Tinnitus, bilateral: Secondary | ICD-10-CM | POA: Diagnosis not present

## 2019-05-21 DIAGNOSIS — H903 Sensorineural hearing loss, bilateral: Secondary | ICD-10-CM | POA: Diagnosis not present

## 2019-05-27 DIAGNOSIS — J3089 Other allergic rhinitis: Secondary | ICD-10-CM | POA: Diagnosis not present

## 2019-05-27 DIAGNOSIS — J3081 Allergic rhinitis due to animal (cat) (dog) hair and dander: Secondary | ICD-10-CM | POA: Diagnosis not present

## 2019-06-05 DIAGNOSIS — J3081 Allergic rhinitis due to animal (cat) (dog) hair and dander: Secondary | ICD-10-CM | POA: Diagnosis not present

## 2019-06-05 DIAGNOSIS — J3089 Other allergic rhinitis: Secondary | ICD-10-CM | POA: Diagnosis not present

## 2019-06-11 ENCOUNTER — Encounter: Payer: Self-pay | Admitting: Family Medicine

## 2019-06-11 ENCOUNTER — Other Ambulatory Visit: Payer: Self-pay

## 2019-06-11 ENCOUNTER — Ambulatory Visit (INDEPENDENT_AMBULATORY_CARE_PROVIDER_SITE_OTHER): Payer: BC Managed Care – PPO | Admitting: Family Medicine

## 2019-06-11 VITALS — BP 116/72 | HR 78 | Temp 98.3°F | Ht 64.5 in | Wt 157.4 lb

## 2019-06-11 DIAGNOSIS — R300 Dysuria: Secondary | ICD-10-CM

## 2019-06-11 DIAGNOSIS — J3089 Other allergic rhinitis: Secondary | ICD-10-CM | POA: Diagnosis not present

## 2019-06-11 DIAGNOSIS — J3081 Allergic rhinitis due to animal (cat) (dog) hair and dander: Secondary | ICD-10-CM | POA: Diagnosis not present

## 2019-06-11 LAB — POCT URINALYSIS DIPSTICK
Bilirubin, UA: NEGATIVE
Blood, UA: POSITIVE
Glucose, UA: NEGATIVE
Ketones, UA: NEGATIVE
Leukocytes, UA: NEGATIVE
Nitrite, UA: NEGATIVE
Protein, UA: NEGATIVE
Spec Grav, UA: 1.015 (ref 1.010–1.025)
Urobilinogen, UA: 0.2 E.U./dL
pH, UA: 6.5 (ref 5.0–8.0)

## 2019-06-11 NOTE — Patient Instructions (Signed)
Increase the Estrace to three times per week  Consider vaginal moisturizer such as Replens and use daily.    Your urine dip does not show signs of infection.

## 2019-06-11 NOTE — Progress Notes (Signed)
  Subjective:     Patient ID: Stephanie Henry, female   DOB: 08-07-1950, 69 y.o.   MRN: [id number]  HPI Stephanie Henry is seen with some mild symptoms of burning with urination inconsistently over the past several weeks.  No fever.  No flank pain.  No gross hematuria.  No nausea or vomiting.  She does use Estrace cream twice weekly.  She is still noted some vaginal drying.  No vaginal discharge.  Past Medical History:  Diagnosis Date  . Allergy   . Arthritis   . Cancer ([place])    basal cell skin cancer   Past Surgical History:  Procedure Laterality Date  . CESAREAN SECTION     x3  . CHOLECYSTECTOMY    . LIPOMA EXCISION N/A 02/14/2017   Procedure: EXCISION POSTERIOR NECK CYST;  Surgeon: Coralie Keens, MD;  Location: [facility];  Service: General;  Laterality: N/A;  . NASAL RECONSTRUCTION    . PARTIAL HYSTERECTOMY    . TONSILLECTOMY      reports that she has quit smoking. She has never used smokeless tobacco. She reports that she does not drink alcohol or use drugs. family history includes Heart disease in her father; Hypertension in her father and mother. Allergies  Allergen Reactions  . Cat Hair Extract Itching  . Other Itching  . Cephalexin Rash  . Adhesive [Tape] Other (See Comments)    Rash/ reddness     Review of Systems  Constitutional: Negative for chills and fever.  Genitourinary: Negative for difficulty urinating, hematuria, vaginal bleeding and vaginal discharge.       Objective:   Physical Exam Vitals reviewed.  Constitutional:      Appearance: Normal appearance.  Cardiovascular:     Rate and Rhythm: Normal rate and regular rhythm.  Pulmonary:     Effort: Pulmonary effort is normal.     Breath sounds: Normal breath sounds.  Neurological:     Mental Status: She is alert.        Assessment:     Mild intermittent dysuria in a postmenopausal patient with increased vaginal drying.  Urine dipstick reveals no leukocytes or nitrites.  Equivocal blood on dipstick.   Suspect she probably has some atrophic vaginitis    Plan:     -Increase Estrace cream use to 3 times weekly -Recommend daily use of Replens vaginal moisturizer -Follow-up with primary if symptoms not resolving over the next several weeks  Eulas Post MD [facility]

## 2019-06-19 DIAGNOSIS — J3081 Allergic rhinitis due to animal (cat) (dog) hair and dander: Secondary | ICD-10-CM | POA: Diagnosis not present

## 2019-06-19 DIAGNOSIS — J3089 Other allergic rhinitis: Secondary | ICD-10-CM | POA: Diagnosis not present

## 2019-06-25 DIAGNOSIS — J3089 Other allergic rhinitis: Secondary | ICD-10-CM | POA: Diagnosis not present

## 2019-06-25 DIAGNOSIS — J3081 Allergic rhinitis due to animal (cat) (dog) hair and dander: Secondary | ICD-10-CM | POA: Diagnosis not present

## 2019-07-09 DIAGNOSIS — J3081 Allergic rhinitis due to animal (cat) (dog) hair and dander: Secondary | ICD-10-CM | POA: Diagnosis not present

## 2019-07-09 DIAGNOSIS — J3089 Other allergic rhinitis: Secondary | ICD-10-CM | POA: Diagnosis not present

## 2019-07-14 DIAGNOSIS — J3081 Allergic rhinitis due to animal (cat) (dog) hair and dander: Secondary | ICD-10-CM | POA: Diagnosis not present

## 2019-07-14 DIAGNOSIS — J3089 Other allergic rhinitis: Secondary | ICD-10-CM | POA: Diagnosis not present

## 2019-07-16 DIAGNOSIS — J3081 Allergic rhinitis due to animal (cat) (dog) hair and dander: Secondary | ICD-10-CM | POA: Diagnosis not present

## 2019-07-16 DIAGNOSIS — J3089 Other allergic rhinitis: Secondary | ICD-10-CM | POA: Diagnosis not present

## 2019-07-30 DIAGNOSIS — Z85828 Personal history of other malignant neoplasm of skin: Secondary | ICD-10-CM | POA: Diagnosis not present

## 2019-07-30 DIAGNOSIS — D225 Melanocytic nevi of trunk: Secondary | ICD-10-CM | POA: Diagnosis not present

## 2019-07-30 DIAGNOSIS — D2262 Melanocytic nevi of left upper limb, including shoulder: Secondary | ICD-10-CM | POA: Diagnosis not present

## 2019-07-30 DIAGNOSIS — L821 Other seborrheic keratosis: Secondary | ICD-10-CM | POA: Diagnosis not present

## 2019-08-07 DIAGNOSIS — J3089 Other allergic rhinitis: Secondary | ICD-10-CM | POA: Diagnosis not present

## 2019-08-07 DIAGNOSIS — J3081 Allergic rhinitis due to animal (cat) (dog) hair and dander: Secondary | ICD-10-CM | POA: Diagnosis not present

## 2019-08-12 DIAGNOSIS — J3089 Other allergic rhinitis: Secondary | ICD-10-CM | POA: Diagnosis not present

## 2019-08-12 DIAGNOSIS — J3081 Allergic rhinitis due to animal (cat) (dog) hair and dander: Secondary | ICD-10-CM | POA: Diagnosis not present

## 2019-08-18 DIAGNOSIS — M1711 Unilateral primary osteoarthritis, right knee: Secondary | ICD-10-CM | POA: Diagnosis not present

## 2019-08-18 DIAGNOSIS — M19072 Primary osteoarthritis, left ankle and foot: Secondary | ICD-10-CM | POA: Diagnosis not present

## 2019-08-18 DIAGNOSIS — M79672 Pain in left foot: Secondary | ICD-10-CM | POA: Diagnosis not present

## 2019-08-18 DIAGNOSIS — M25561 Pain in right knee: Secondary | ICD-10-CM | POA: Diagnosis not present

## 2019-08-20 DIAGNOSIS — J3089 Other allergic rhinitis: Secondary | ICD-10-CM | POA: Diagnosis not present

## 2019-08-20 DIAGNOSIS — J3081 Allergic rhinitis due to animal (cat) (dog) hair and dander: Secondary | ICD-10-CM | POA: Diagnosis not present

## 2019-08-27 DIAGNOSIS — J3081 Allergic rhinitis due to animal (cat) (dog) hair and dander: Secondary | ICD-10-CM | POA: Diagnosis not present

## 2019-08-27 DIAGNOSIS — J3089 Other allergic rhinitis: Secondary | ICD-10-CM | POA: Diagnosis not present

## 2019-09-02 DIAGNOSIS — J3089 Other allergic rhinitis: Secondary | ICD-10-CM | POA: Diagnosis not present

## 2019-09-02 DIAGNOSIS — J3081 Allergic rhinitis due to animal (cat) (dog) hair and dander: Secondary | ICD-10-CM | POA: Diagnosis not present

## 2019-09-11 DIAGNOSIS — J3089 Other allergic rhinitis: Secondary | ICD-10-CM | POA: Diagnosis not present

## 2019-09-11 DIAGNOSIS — J3081 Allergic rhinitis due to animal (cat) (dog) hair and dander: Secondary | ICD-10-CM | POA: Diagnosis not present

## 2019-09-15 ENCOUNTER — Other Ambulatory Visit: Payer: Self-pay

## 2019-09-16 ENCOUNTER — Encounter: Payer: Self-pay | Admitting: Family Medicine

## 2019-09-16 ENCOUNTER — Ambulatory Visit (INDEPENDENT_AMBULATORY_CARE_PROVIDER_SITE_OTHER): Payer: BC Managed Care – PPO | Admitting: Family Medicine

## 2019-09-16 VITALS — BP 96/60 | HR 69 | Temp 98.0°F | Resp 12 | Ht 64.5 in | Wt 154.2 lb

## 2019-09-16 DIAGNOSIS — J3089 Other allergic rhinitis: Secondary | ICD-10-CM | POA: Diagnosis not present

## 2019-09-16 DIAGNOSIS — W57XXXA Bitten or stung by nonvenomous insect and other nonvenomous arthropods, initial encounter: Secondary | ICD-10-CM | POA: Diagnosis not present

## 2019-09-16 DIAGNOSIS — S80862A Insect bite (nonvenomous), left lower leg, initial encounter: Secondary | ICD-10-CM

## 2019-09-16 DIAGNOSIS — J3081 Allergic rhinitis due to animal (cat) (dog) hair and dander: Secondary | ICD-10-CM | POA: Diagnosis not present

## 2019-09-16 DIAGNOSIS — M67441 Ganglion, right hand: Secondary | ICD-10-CM | POA: Diagnosis not present

## 2019-09-16 MED ORDER — DOXYCYCLINE HYCLATE 100 MG PO TABS: 200.0000 mg | ORAL_TABLET | Freq: Once | ORAL | 0 refills | Status: AC

## 2019-09-16 NOTE — Patient Instructions (Signed)
A few things to remember from today's visit:   Tick bite of left lower leg, initial encounter - Plan: B. burgdorfi antibodies, doxycycline (VIBRA-TABS) 100 MG tablet  Take antibiotic with food. In a couple week lab at [facility]. Monitor for changes.  If you need refills please call your pharmacy. Do not use My Chart to request refills or for acute issues that need immediate attention.    Please be sure medication list is accurate. If a new problem present, please set up appointment sooner than planned today.

## 2019-09-16 NOTE — Progress Notes (Signed)
ACUTE VISIT  Chief Complaint  Patient presents with  . Tick Removal    tick bite on last Monday behind left knee  . Cyst    cyst on right hand   HPI: Stephanie Henry is a 69 y.o. female, who is here today with above complaints. About a week ago she found a tick that was barely attached to her skin, not engorged, and for < 24 hours. She had been working on her yard earlier same day.  Red spot noted on area, stable in size. 4 days ago lesion started itching. She is concerned because 3 days ago noted clear center, "bull eye" pattern. She is concerned about Lyme disease. Lesion is" fading." Applied hand sanitizer. Negative for fever,chills,unusual fatigue,sore throat, or myalgies.  She is also concerned about small "bump" in dorsum of right hand. It does not seem to be growing, not tender. No Hx of trauma. + OA.  Review of Systems  Constitutional: Negative for appetite change.  HENT: Negative for facial swelling and mouth sores.   Respiratory: Negative for cough, shortness of breath and wheezing.   Cardiovascular: Negative for leg swelling.  Gastrointestinal: Negative for abdominal pain, diarrhea, nausea and vomiting.  Musculoskeletal: Positive for arthralgias. Negative for gait problem.  Skin: Positive for rash.  Allergic/Immunologic: Positive for environmental allergies.  Neurological: Negative for weakness, numbness and headaches.  Psychiatric/Behavioral: Negative for confusion. The patient is nervous/anxious.   Rest see pertinent positives and negatives per HPI.  Current Outpatient Medications on File Prior to Visit  Medication Sig Dispense Refill  . betamethasone dipropionate (DIPROLENE) 0.05 % cream Apply 1 application daily as needed topically (rash on elbow).     . Calcium Carb-Cholecalciferol (CALCIUM + D3) 600-200 MG-UNIT TABS Take 1 tablet by mouth daily.    Marland Kitchen docusate sodium (STOOL SOFTENER) 100 MG capsule Take 100 mg at bedtime by mouth.     . EPINEPHrine  0.3 mg/0.3 mL IJ SOAJ injection epinephrine 0.3 mg/0.3 mL injection, auto-injector    . estradiol (ESTRACE) 0.1 MG/GM vaginal cream Place 0.5 g vaginally 2 (two) times a week.    Marland Kitchen Fexofenadine HCl (ALLEGRA ALLERGY PO) Allegra Allergy    . fluticasone (FLONASE) 50 MCG/ACT nasal spray Place 1 spray into both nostrils 2 (two) times daily. 16 g 0  . ibuprofen (ADVIL,MOTRIN) 200 MG tablet Take 200 mg at bedtime by mouth.     . METRONIDAZOLE, TOPICAL, 0.75 % LOTN APPLY  THIN LAYER TO FACE ONCE DAILY AS NEEDED FOR ROSCEA  2  . PREVIDENT 5000 BOOSTER PLUS 1.1 % PSTE     . Probiotic Product (PROBIOTIC PO) Take 1 tablet by mouth daily.     No current facility-administered medications on file prior to visit.     Past Medical History:  Diagnosis Date  . Allergy   . Arthritis   . Cancer ([place])    basal cell skin cancer   Allergies  Allergen Reactions  . Cat Hair Extract Itching  . Other Itching  . Cephalexin Rash  . Adhesive [Tape] Other (See Comments)    Rash/ reddness    Social History   Socioeconomic History  . Marital status: Married    Spouse name: Not on file  . Number of children: Not on file  . Years of education: Not on file  . Highest education level: Not on file  Occupational History  . Not on file  Tobacco Use  . Smoking status: Former Research scientist (life sciences)  . Smokeless tobacco: Never  Used  . Tobacco comment: quit 30 + years ago  Vaping Use  . Vaping Use: Former  Substance and Sexual Activity  . Alcohol use: No  . Drug use: No  . Sexual activity: Yes    Partners: Male  Other Topics Concern  . Not on file  Social History Narrative  . Not on file   Social Determinants of Health   Financial Resource Strain:   . Difficulty of Paying Living Expenses:   Food Insecurity:   . Worried About Charity fundraiser in the Last Year:   . Arboriculturist in the Last Year:   Transportation Needs:   . Film/video editor (Medical):   Marland Kitchen Lack of Transportation (Non-Medical):     Physical Activity:   . Days of Exercise per Week:   . Minutes of Exercise per Session:   Stress:   . Feeling of Stress :   Social Connections:   . Frequency of Communication with Friends and Family:   . Frequency of Social Gatherings with Friends and Family:   . Attends Religious Services:   . Active Member of Clubs or Organizations:   . Attends Archivist Meetings:   Marland Kitchen Marital Status:     Vitals:   09/16/19 0736  BP: 96/60  Pulse: 69  Resp: 12  Temp: 98 F (36.7 C)  SpO2: 97%   Body mass index is 26.07 kg/m.  Physical Exam  Constitutional: She is oriented to person, place, and time. She appears well-developed. No distress.  HENT:  Head: Atraumatic.  Eyes: Conjunctivae are normal.  Cardiovascular: Normal rate and regular rhythm.  No murmur heard. Respiratory: Effort normal and breath sounds normal. No respiratory distress.  GI: Normal appearance.  Musculoskeletal:       Hands:     Comments: Rounded,soft lesion ulnar aspect of dorsal area. Defined borders,not tender.  Lymphadenopathy:    She has no cervical adenopathy.  Neurological: She is alert and oriented to person, place, and time.  Skin: Skin is warm. Rash noted.     Medial and posterior aspect of left knee macula erythematous,rounded lesion. About 3-4 cm. Clear center, no indurated,local heat,or tenderness.  Psychiatric: Her speech is normal. Affect normal. Her mood appears anxious.  Well groomed, good eye contact.    ASSESSMENT AND PLAN:  Stephanie Henry was seen today for tick removal and cyst.  Diagnoses and all orders for this visit: Orders Placed This Encounter  Procedures  . B. burgdorfi antibodies    Tick bite of left lower leg, initial encounter Based on hx the probability of getting lyme disease is low. Doxycycline 200 mg x 1. Monitor for new symptoms. Lyme test to be done in 2-3 weeks. Instructed about warning sings.  Monitor for signs of infection.  -     doxycycline  (VIBRA-TABS) 100 MG tablet; Take 2 tablets (200 mg total) by mouth once for 1 dose.  Ganglion of right hand Very small. Educated about Dx and treatment.   Return if symptoms worsen or fail to improve.   Ashanti Ratti G. [place], MD  [facility]. [place] office.  Discharge Instructions   None    A few things to remember from today's visit:   Tick bite of left lower leg, initial encounter - Plan: B. burgdorfi antibodies, doxycycline (VIBRA-TABS) 100 MG tablet  Take antibiotic with food. In a couple week lab at [facility]. Monitor for changes.  If you need refills please call your pharmacy. Do not use My  Chart to request refills or for acute issues that need immediate attention.    Please be sure medication list is accurate. If a new problem present, please set up appointment sooner than planned today.

## 2019-09-30 DIAGNOSIS — M1711 Unilateral primary osteoarthritis, right knee: Secondary | ICD-10-CM | POA: Diagnosis not present

## 2019-10-02 ENCOUNTER — Other Ambulatory Visit: Payer: BC Managed Care – PPO

## 2019-10-02 DIAGNOSIS — W57XXXA Bitten or stung by nonvenomous insect and other nonvenomous arthropods, initial encounter: Secondary | ICD-10-CM | POA: Diagnosis not present

## 2019-10-02 DIAGNOSIS — S80862A Insect bite (nonvenomous), left lower leg, initial encounter: Secondary | ICD-10-CM

## 2019-10-03 DIAGNOSIS — J3081 Allergic rhinitis due to animal (cat) (dog) hair and dander: Secondary | ICD-10-CM | POA: Diagnosis not present

## 2019-10-03 DIAGNOSIS — J3089 Other allergic rhinitis: Secondary | ICD-10-CM | POA: Diagnosis not present

## 2019-10-03 LAB — B. BURGDORFI ANTIBODIES: B burgdorferi Ab IgG+IgM: <0.9 index

## 2019-10-07 DIAGNOSIS — M1711 Unilateral primary osteoarthritis, right knee: Secondary | ICD-10-CM | POA: Diagnosis not present

## 2019-10-10 DIAGNOSIS — J3081 Allergic rhinitis due to animal (cat) (dog) hair and dander: Secondary | ICD-10-CM | POA: Diagnosis not present

## 2019-10-10 DIAGNOSIS — J3089 Other allergic rhinitis: Secondary | ICD-10-CM | POA: Diagnosis not present

## 2019-10-14 DIAGNOSIS — M1711 Unilateral primary osteoarthritis, right knee: Secondary | ICD-10-CM | POA: Diagnosis not present

## 2019-10-17 DIAGNOSIS — J3081 Allergic rhinitis due to animal (cat) (dog) hair and dander: Secondary | ICD-10-CM | POA: Diagnosis not present

## 2019-10-17 DIAGNOSIS — J3089 Other allergic rhinitis: Secondary | ICD-10-CM | POA: Diagnosis not present

## 2019-10-24 DIAGNOSIS — J3089 Other allergic rhinitis: Secondary | ICD-10-CM | POA: Diagnosis not present

## 2019-10-24 DIAGNOSIS — J3081 Allergic rhinitis due to animal (cat) (dog) hair and dander: Secondary | ICD-10-CM | POA: Diagnosis not present

## 2019-10-24 DIAGNOSIS — J301 Allergic rhinitis due to pollen: Secondary | ICD-10-CM | POA: Diagnosis not present

## 2019-10-28 DIAGNOSIS — J3081 Allergic rhinitis due to animal (cat) (dog) hair and dander: Secondary | ICD-10-CM | POA: Diagnosis not present

## 2019-10-28 DIAGNOSIS — J3089 Other allergic rhinitis: Secondary | ICD-10-CM | POA: Diagnosis not present

## 2019-11-07 DIAGNOSIS — J301 Allergic rhinitis due to pollen: Secondary | ICD-10-CM | POA: Diagnosis not present

## 2019-11-07 DIAGNOSIS — J3089 Other allergic rhinitis: Secondary | ICD-10-CM | POA: Diagnosis not present

## 2019-11-12 DIAGNOSIS — J3081 Allergic rhinitis due to animal (cat) (dog) hair and dander: Secondary | ICD-10-CM | POA: Diagnosis not present

## 2019-11-12 DIAGNOSIS — J3089 Other allergic rhinitis: Secondary | ICD-10-CM | POA: Diagnosis not present

## 2019-11-19 DIAGNOSIS — J310 Chronic rhinitis: Secondary | ICD-10-CM | POA: Diagnosis not present

## 2019-11-19 DIAGNOSIS — R430 Anosmia: Secondary | ICD-10-CM | POA: Diagnosis not present

## 2019-11-19 DIAGNOSIS — J3081 Allergic rhinitis due to animal (cat) (dog) hair and dander: Secondary | ICD-10-CM | POA: Diagnosis not present

## 2019-11-19 DIAGNOSIS — R438 Other disturbances of smell and taste: Secondary | ICD-10-CM | POA: Diagnosis not present

## 2019-11-19 DIAGNOSIS — H903 Sensorineural hearing loss, bilateral: Secondary | ICD-10-CM | POA: Diagnosis not present

## 2019-11-19 DIAGNOSIS — J343 Hypertrophy of nasal turbinates: Secondary | ICD-10-CM | POA: Diagnosis not present

## 2019-11-19 DIAGNOSIS — J342 Deviated nasal septum: Secondary | ICD-10-CM | POA: Diagnosis not present

## 2019-11-19 DIAGNOSIS — J3089 Other allergic rhinitis: Secondary | ICD-10-CM | POA: Diagnosis not present

## 2019-12-04 DIAGNOSIS — J3081 Allergic rhinitis due to animal (cat) (dog) hair and dander: Secondary | ICD-10-CM | POA: Diagnosis not present

## 2019-12-04 DIAGNOSIS — J3089 Other allergic rhinitis: Secondary | ICD-10-CM | POA: Diagnosis not present

## 2019-12-11 DIAGNOSIS — J3089 Other allergic rhinitis: Secondary | ICD-10-CM | POA: Diagnosis not present

## 2019-12-11 DIAGNOSIS — J3081 Allergic rhinitis due to animal (cat) (dog) hair and dander: Secondary | ICD-10-CM | POA: Diagnosis not present

## 2019-12-30 DIAGNOSIS — Z6827 Body mass index (BMI) 27.0-27.9, adult: Secondary | ICD-10-CM | POA: Diagnosis not present

## 2019-12-30 DIAGNOSIS — Z01419 Encounter for gynecological examination (general) (routine) without abnormal findings: Secondary | ICD-10-CM | POA: Diagnosis not present

## 2019-12-30 DIAGNOSIS — Z1272 Encounter for screening for malignant neoplasm of vagina: Secondary | ICD-10-CM | POA: Diagnosis not present

## 2019-12-30 DIAGNOSIS — Z1231 Encounter for screening mammogram for malignant neoplasm of breast: Secondary | ICD-10-CM | POA: Diagnosis not present

## 2020-01-06 DIAGNOSIS — J3081 Allergic rhinitis due to animal (cat) (dog) hair and dander: Secondary | ICD-10-CM | POA: Diagnosis not present

## 2020-01-06 DIAGNOSIS — J3089 Other allergic rhinitis: Secondary | ICD-10-CM | POA: Diagnosis not present

## 2020-01-16 DIAGNOSIS — J3081 Allergic rhinitis due to animal (cat) (dog) hair and dander: Secondary | ICD-10-CM | POA: Diagnosis not present

## 2020-01-16 DIAGNOSIS — J3089 Other allergic rhinitis: Secondary | ICD-10-CM | POA: Diagnosis not present

## 2020-01-21 DIAGNOSIS — J3081 Allergic rhinitis due to animal (cat) (dog) hair and dander: Secondary | ICD-10-CM | POA: Diagnosis not present

## 2020-01-21 DIAGNOSIS — J3089 Other allergic rhinitis: Secondary | ICD-10-CM | POA: Diagnosis not present

## 2020-01-26 DIAGNOSIS — J3081 Allergic rhinitis due to animal (cat) (dog) hair and dander: Secondary | ICD-10-CM | POA: Diagnosis not present

## 2020-01-26 DIAGNOSIS — J3089 Other allergic rhinitis: Secondary | ICD-10-CM | POA: Diagnosis not present

## 2020-02-06 DIAGNOSIS — J301 Allergic rhinitis due to pollen: Secondary | ICD-10-CM | POA: Diagnosis not present

## 2020-02-06 DIAGNOSIS — M6740 Ganglion, unspecified site: Secondary | ICD-10-CM | POA: Diagnosis not present

## 2020-02-06 DIAGNOSIS — J3081 Allergic rhinitis due to animal (cat) (dog) hair and dander: Secondary | ICD-10-CM | POA: Diagnosis not present

## 2020-02-06 DIAGNOSIS — J3089 Other allergic rhinitis: Secondary | ICD-10-CM | POA: Diagnosis not present

## 2020-02-23 ENCOUNTER — Other Ambulatory Visit: Payer: BC Managed Care – PPO

## 2020-02-24 ENCOUNTER — Other Ambulatory Visit: Payer: BC Managed Care – PPO

## 2020-02-24 DIAGNOSIS — Z20822 Contact with and (suspected) exposure to covid-19: Secondary | ICD-10-CM

## 2020-02-24 DIAGNOSIS — J3089 Other allergic rhinitis: Secondary | ICD-10-CM | POA: Diagnosis not present

## 2020-02-24 DIAGNOSIS — J301 Allergic rhinitis due to pollen: Secondary | ICD-10-CM | POA: Diagnosis not present

## 2020-02-24 DIAGNOSIS — J3081 Allergic rhinitis due to animal (cat) (dog) hair and dander: Secondary | ICD-10-CM | POA: Diagnosis not present

## 2020-02-25 LAB — SARS-COV-2, NAA 2 DAY TAT

## 2020-02-25 LAB — NOVEL CORONAVIRUS, NAA: SARS-CoV-2, NAA: NOT DETECTED

## 2020-03-12 DIAGNOSIS — J3081 Allergic rhinitis due to animal (cat) (dog) hair and dander: Secondary | ICD-10-CM | POA: Diagnosis not present

## 2020-03-12 DIAGNOSIS — J3089 Other allergic rhinitis: Secondary | ICD-10-CM | POA: Diagnosis not present

## 2020-03-16 DIAGNOSIS — H40023 Open angle with borderline findings, high risk, bilateral: Secondary | ICD-10-CM | POA: Diagnosis not present

## 2020-03-23 ENCOUNTER — Ambulatory Visit (INDEPENDENT_AMBULATORY_CARE_PROVIDER_SITE_OTHER): Payer: BC Managed Care – PPO | Admitting: Family Medicine

## 2020-03-23 ENCOUNTER — Other Ambulatory Visit: Payer: Self-pay

## 2020-03-23 VITALS — BP 116/80 | HR 65 | Temp 98.0°F | Resp 16 | Ht 64.5 in | Wt 156.0 lb

## 2020-03-23 DIAGNOSIS — E041 Nontoxic single thyroid nodule: Secondary | ICD-10-CM | POA: Diagnosis not present

## 2020-03-23 DIAGNOSIS — Z0000 Encounter for general adult medical examination without abnormal findings: Secondary | ICD-10-CM | POA: Diagnosis not present

## 2020-03-23 DIAGNOSIS — J3089 Other allergic rhinitis: Secondary | ICD-10-CM | POA: Diagnosis not present

## 2020-03-23 DIAGNOSIS — E785 Hyperlipidemia, unspecified: Secondary | ICD-10-CM | POA: Diagnosis not present

## 2020-03-23 DIAGNOSIS — J3081 Allergic rhinitis due to animal (cat) (dog) hair and dander: Secondary | ICD-10-CM | POA: Diagnosis not present

## 2020-03-23 DIAGNOSIS — Z23 Encounter for immunization: Secondary | ICD-10-CM

## 2020-03-23 NOTE — Assessment & Plan Note (Signed)
Continue non pharmacologic treatment. Further recommendations according to 10 years CVD risk score and lipid panel numbers.

## 2020-03-23 NOTE — Patient Instructions (Addendum)
Today you have you routine preventive visit. A few things to remember from today's visit:   Hyperlipidemia, unspecified hyperlipidemia type  Thyroid nodule - Plan: US THYROID  Routine general medical examination at a health care facility  Continue shoulder range of motion exercises. Fall precautions.  Please be sure medication list is accurate. If a new problem present, please set up appointment sooner than planned today.  At least 150 minutes of moderate exercise per week, daily brisk walking for 15-30 min is a good exercise option. Healthy diet low in saturated (animal) fats and sweets and consisting of fresh fruits and vegetables, lean meats such as fish and white chicken and whole grains.  These are some of recommendations for screening depending of age and risk factors:  - Vaccines:  Tdap vaccine every 10 years.  Shingles vaccine recommended at age 74, could be given after 68 years of age but not sure about insurance coverage.   Pneumonia vaccines: Pneumovax at 20. Sometimes Pneumovax is giving earlier if history of smoking, lung disease,diabetes,kidney disease among some.  Screening for diabetes at age 19 and every 3 years.  Cervical cancer prevention:  Pap smear starts at 68 years of age and continues periodically until 68 years old in low risk women. Pap smear every 3 years between 79 and 50 years old. Pap smear every 3-5 years between women 47 and older if pap smear negative and HPV screening negative.   -Breast cancer: Mammogram: There is disagreement between experts about when to start screening in low risk asymptomatic female but recent recommendations are to start screening at 46 and not later than 68 years old , every 1-2 years and after 68 yo q 2 years. Screening is recommended until 68 years old but some women can continue screening depending of healthy issues.  Colon cancer screening: Has been recently changed to 68 yo. Insurance may not cover until you are 68  years old. Screening is recommended until 68 years old.  Cholesterol disorder screening at age 49 and every 3 years.N/A  Also recommended:  1. Dental visit- Brush and floss your teeth twice daily; visit your dentist twice a year. 2. Eye doctor- Get an eye exam at least every 2 years. 3. Helmet use- Always wear a helmet when riding a bicycle, motorcycle, rollerblading or skateboarding. 4. Safe sex- If you may be exposed to sexually transmitted infections, use a condom. 5. Seat belts- Seat belts can save your live; always wear one. 6. Smoke/Carbon Monoxide detectors- These detectors need to be installed on the appropriate level of your home. Replace batteries at least once a year. 7. Skin cancer- When out in the sun please cover up and use sunscreen 15 SPF or higher. 8. Violence- If anyone is threatening or hurting you, please tell your healthcare provider.  9. Drink alcohol in moderation- Limit alcohol intake to one drink or less per day. Never drink and drive. 10. Calcium supplementation 1000 to 1200 mg daily, ideally through your diet.  Vitamin D supplementation 800 units daily.

## 2020-03-23 NOTE — Progress Notes (Signed)
HPI: Stephanie Henry is a 69 y.o. female, who is here today for her routine physical.  Last CPE: 10/18/18.  Regular exercise 3 or more time per week: Treadmill and recumbent bike and sometimes wt and aerobics. At least 3 times per week. and  Following a healthy diet: In general she does "try to balance it out." If she eats unhealthy one day she tries to do better next day, has salad. She lives with her husband and son.  Chronic medical problems: Hearing loss, thyroid nodule,allergies,HLD.  Pap smear :Current, 12/2019. Follows with gyn regularly.  Immunization History  Administered Date(s) Administered  . Fluad Quad(high Dose 65+) 03/23/2020  . Influenza, High Dose Seasonal PF 12/08/2016, 04/27/2018  . Influenza-Unspecified 02/21/2017, 01/18/2019  . PFIZER SARS-COV-2 Vaccination 04/14/2019, 05/05/2019  . Pneumococcal Conjugate-13 12/08/2016  . Pneumococcal Polysaccharide-23 10/18/2018   Mammogram: 12/2019 at her gyn's office. Colonoscopy: 06/2013. DEXA: 11/2016  Hep C screening: 12/2016 NR.  She has no new concerns today.  HLD:She is on non pharmacologic treatment.  Lab Results  Component Value Date   CHOL 222 (H) 10/18/2018   HDL 45.70 10/18/2018   LDLCALC 150 (H) 10/18/2018   TRIG 127.0 10/18/2018   CHOLHDL 5 10/18/2018   Thyroid US on 10/31/18 right thyroid nodule.  1 cm right isthmus TR 4 nodule meets criteria for follow-up 1 year. TSH 1.19 in 10/2018.  Review of Systems  Constitutional: Negative for appetite change, fatigue and fever.  HENT: Positive for hearing loss. Negative for dental problem, mouth sores, sore throat, trouble swallowing and voice change.   Eyes: Negative for redness and visual disturbance.  Respiratory: Negative for cough, shortness of breath and wheezing.   Cardiovascular: Negative for chest pain and leg swelling.  Gastrointestinal: Negative for abdominal pain, nausea and vomiting.       No changes in bowel habits.  Endocrine: Negative  for cold intolerance, heat intolerance, polydipsia, polyphagia and polyuria.  Genitourinary: Negative for decreased urine volume, dysuria, hematuria, vaginal bleeding and vaginal discharge.  Musculoskeletal: Positive for arthralgias. Negative for gait problem and myalgias.  Skin: Negative for color change and rash.  Allergic/Immunologic: Positive for environmental allergies.  Neurological: Negative for syncope, weakness and headaches.  Hematological: Negative for adenopathy. Does not bruise/bleed easily.  Psychiatric/Behavioral: Negative for confusion and sleep disturbance. The patient is not nervous/anxious.   All other systems reviewed and are negative.   Current Outpatient Medications on File Prior to Visit  Medication Sig Dispense Refill  . betamethasone dipropionate (DIPROLENE) 0.05 % cream Apply 1 application daily as needed topically (rash on elbow).     . Calcium Carb-Cholecalciferol (CALCIUM + D3) 600-200 MG-UNIT TABS Take 1 tablet by mouth daily.    Marland Kitchen docusate sodium (STOOL SOFTENER) 100 MG capsule Take 100 mg at bedtime by mouth.     . EPINEPHrine 0.3 mg/0.3 mL IJ SOAJ injection epinephrine 0.3 mg/0.3 mL injection, auto-injector    . estradiol (ESTRACE) 0.1 MG/GM vaginal cream Place 0.5 g vaginally 2 (two) times a week.    Marland Kitchen Fexofenadine HCl (ALLEGRA ALLERGY PO) Allegra Allergy    . fluticasone (FLONASE) 50 MCG/ACT nasal spray Place 1 spray into both nostrils 2 (two) times daily. 16 g 0  . ibuprofen (ADVIL,MOTRIN) 200 MG tablet Take 200 mg at bedtime by mouth.     . METRONIDAZOLE, TOPICAL, 0.75 % LOTN APPLY  THIN LAYER TO FACE ONCE DAILY AS NEEDED FOR ROSCEA  2  . PREVIDENT 5000 BOOSTER PLUS 1.1 % PSTE     .  Probiotic Product (PROBIOTIC PO) Take 1 tablet by mouth daily.     No current facility-administered medications on file prior to visit.   Past Medical History:  Diagnosis Date  . Allergy   . Arthritis   . Cancer ([place])    basal cell skin cancer    Past Surgical  History:  Procedure Laterality Date  . CESAREAN SECTION     x3  . CHOLECYSTECTOMY    . LIPOMA EXCISION N/A 02/14/2017   Procedure: EXCISION POSTERIOR NECK CYST;  Surgeon: Coralie Keens, MD;  Location: [place];  Service: General;  Laterality: N/A;  . NASAL RECONSTRUCTION    . PARTIAL HYSTERECTOMY    . TONSILLECTOMY      Allergies  Allergen Reactions  . Cat Hair Extract Itching  . Other Itching  . Cephalexin Rash  . Adhesive [Tape] Other (See Comments)    Rash/ reddness    Family History  Problem Relation Age of Onset  . Hypertension Mother   . Heart disease Father        CAD  . Hypertension Father   . Colon cancer Neg Hx     Social History   Socioeconomic History  . Marital status: Married    Spouse name: Not on file  . Number of children: Not on file  . Years of education: Not on file  . Highest education level: Not on file  Occupational History  . Not on file  Tobacco Use  . Smoking status: Former Research scientist (life sciences)  . Smokeless tobacco: Never Used  . Tobacco comment: quit 30 + years ago  Vaping Use  . Vaping Use: Former  Substance and Sexual Activity  . Alcohol use: No  . Drug use: No  . Sexual activity: Yes    Partners: Male  Other Topics Concern  . Not on file  Social History Narrative  . Not on file   Social Determinants of Health   Financial Resource Strain: Not on file  Food Insecurity: Not on file  Transportation Needs: Not on file  Physical Activity: Not on file  Stress: Not on file  Social Connections: Not on file   Vitals:   03/23/20 1112  BP: 116/80  Pulse: 65  Resp: 16  Temp: 98 F (36.7 C)  SpO2: 98%   Body mass index is 26.36 kg/m.   Wt Readings from Last 3 Encounters:  03/23/20 156 lb (70.8 kg)  09/16/19 154 lb 4 oz (70 kg)  06/11/19 157 lb 6.4 oz (71.4 kg)   Physical Exam Vitals and nursing note reviewed.  Constitutional:      General: She is not in acute distress.    Appearance: She is well-developed.  HENT:     Head:  Normocephalic and atraumatic.     Right Ear: Hearing, tympanic membrane, ear canal and external ear normal.     Left Ear: Hearing, tympanic membrane, ear canal and external ear normal.     Mouth/Throat:     Mouth: Oropharynx is clear and moist and mucous membranes are normal. Mucous membranes are moist.     Pharynx: Oropharynx is clear. Uvula midline.  Eyes:     Extraocular Movements: Extraocular movements intact and EOM normal.     Conjunctiva/sclera: Conjunctivae normal.     Pupils: Pupils are equal, round, and reactive to light.  Neck:     Thyroid: No thyromegaly.     Trachea: No tracheal deviation.  Cardiovascular:     Rate and Rhythm: Normal rate and regular rhythm.  Pulses:          Dorsalis pedis pulses are 2+ on the right side and 2+ on the left side.     Heart sounds: No murmur heard.   Pulmonary:     Effort: Pulmonary effort is normal. No respiratory distress.     Breath sounds: Normal breath sounds.  Chest:  Breasts:     Right: No supraclavicular adenopathy.     Left: No supraclavicular adenopathy.    Abdominal:     Palpations: Abdomen is soft. There is no hepatomegaly or mass.     Tenderness: There is no abdominal tenderness.  Genitourinary:    Comments: Deferred to gyn. Musculoskeletal:        General: No edema.     Right shoulder: Tenderness (with some rotator cuff maneuvers ) present. No deformity, effusion or bony tenderness. Normal range of motion.     Comments: No sign synovitis appreciated.  Lymphadenopathy:     Cervical: No cervical adenopathy.     Upper Body:     Right upper body: No supraclavicular adenopathy.     Left upper body: No supraclavicular adenopathy.  Skin:    General: Skin is warm.     Findings: No erythema or rash.  Neurological:     General: No focal deficit present.     Mental Status: She is alert and oriented to person, place, and time.     Cranial Nerves: No cranial nerve deficit.     Coordination: Coordination normal.      Gait: Gait normal.     Deep Tendon Reflexes: Strength normal.     Reflex Scores:      Bicep reflexes are 2+ on the right side and 2+ on the left side.      Patellar reflexes are 2+ on the right side and 2+ on the left side. Psychiatric:        Mood and Affect: Mood and affect normal.        Speech: Speech normal.     Comments: Well groomed, good eye contact.   ASSESSMENT AND PLAN:  Ms. Stephanie Henry was here today annual physical examination.  Orders Placed This Encounter  Procedures  . US THYROID  . Flu Vaccine QUAD High Dose(Fluad)  . BASIC METABOLIC PANEL WITH GFR  . Lipid panel  . TSH   Routine general medical examination at a health care facility We discussed the importance of regular physical activity and healthy diet for prevention of chronic illness and/or complications. Preventive guidelines reviewed. Vaccination up to date. Female preventive care with gyn. Ca++ and vit D supplementation recommended. Next CPE in a year.  The 10-year ASCVD risk score Mikey Bussing DC Brooke Bonito., et al., 2013) is: 6.6%   Values used to calculate the score:     Age: 28 years     Sex: Female     Is Non-Hispanic African American: No     Diabetic: No     Tobacco smoker: No     Systolic Blood Pressure: 761 mmHg     Is BP treated: No     HDL Cholesterol: 50 mg/dL     Total Cholesterol: 206 mg/dL  Thyroid nodule Further recommendations according to TSH and thyroid US results.  Need for influenza vaccination -     Flu Vaccine QUAD High Dose(Fluad)  Hyperlipidemia Continue non pharmacologic treatment. Further recommendations according to 10 years CVD risk score and lipid panel numbers.   Return in 1 year (on 03/23/2021) for cpe.  Orren Pietsch G. Martinique, MD  [facility]. [place] office.   Today you have you routine preventive visit. A few things to remember from today's visit:  Hyperlipidemia, unspecified hyperlipidemia type  Thyroid nodule - Plan: US THYROID  Routine general  medical examination at a health care facility  Continue shoulder range of motion exercises. Fall precautions.  Please be sure medication list is accurate. If a new problem present, please set up appointment sooner than planned today.  At least 150 minutes of moderate exercise per week, daily brisk walking for 15-30 min is a good exercise option. Healthy diet low in saturated (animal) fats and sweets and consisting of fresh fruits and vegetables, lean meats such as fish and white chicken and whole grains.  These are some of recommendations for screening depending of age and risk factors:  - Vaccines:  Tdap vaccine every 10 years.  Shingles vaccine recommended at age 49, could be given after 68 years of age but not sure about insurance coverage.   Pneumonia vaccines: Pneumovax at 99. Sometimes Pneumovax is giving earlier if history of smoking, lung disease,diabetes,kidney disease among some.  Screening for diabetes at age 84 and every 3 years.  Cervical cancer prevention:  Pap smear starts at 68 years of age and continues periodically until 68 years old in low risk women. Pap smear every 3 years between 84 and 62 years old. Pap smear every 3-5 years between women 29 and older if pap smear negative and HPV screening negative.   -Breast cancer: Mammogram: There is disagreement between experts about when to start screening in low risk asymptomatic female but recent recommendations are to start screening at 32 and not later than 68 years old , every 1-2 years and after 68 yo q 2 years. Screening is recommended until 68 years old but some women can continue screening depending of healthy issues.  Colon cancer screening: Has been recently changed to 68 yo. Insurance may not cover until you are 68 years old. Screening is recommended until 68 years old.  Cholesterol disorder screening at age 9 and every 3 years.N/A  Also recommended:  1. Dental visit- Brush and floss your teeth twice  daily; visit your dentist twice a year. 2. Eye doctor- Get an eye exam at least every 2 years. 3. Helmet use- Always wear a helmet when riding a bicycle, motorcycle, rollerblading or skateboarding. 4. Safe sex- If you may be exposed to sexually transmitted infections, use a condom. 5. Seat belts- Seat belts can save your live; always wear one. 6. Smoke/Carbon Monoxide detectors- These detectors need to be installed on the appropriate level of your home. Replace batteries at least once a year. 7. Skin cancer- When out in the sun please cover up and use sunscreen 15 SPF or higher. 8. Violence- If anyone is threatening or hurting you, please tell your healthcare provider.  9. Drink alcohol in moderation- Limit alcohol intake to one drink or less per day. Never drink and drive. 10. Calcium supplementation 1000 to 1200 mg daily, ideally through your diet.  Vitamin D supplementation 800 units daily.

## 2020-03-24 LAB — BASIC METABOLIC PANEL WITH GFR
BUN: 16 mg/dL (ref 7–25)
CO2: 32 mmol/L (ref 20–32)
Calcium: 9.5 mg/dL (ref 8.6–10.4)
Chloride: 104 mmol/L (ref 98–110)
Creat: 0.65 mg/dL (ref 0.50–0.99)
GFR, Est African American: 106 mL/min/{1.73_m2} (ref >=60)
GFR, Est Non African American: 91 mL/min/{1.73_m2} (ref >=60)
Glucose, Bld: 92 mg/dL (ref 65–99)
Potassium: 4.5 mmol/L (ref 3.5–5.3)
Sodium: 141 mmol/L (ref 135–146)

## 2020-03-24 LAB — LIPID PANEL
Cholesterol: 206 mg/dL — ABNORMAL HIGH (ref <200)
HDL: 50 mg/dL (ref >=50)
LDL Cholesterol (Calc): 133 mg/dL (calc) — ABNORMAL HIGH
Non-HDL Cholesterol (Calc): 156 mg/dL (calc) — ABNORMAL HIGH (ref <130)
Total CHOL/HDL Ratio: 4.1 (calc) (ref <5.0)
Triglycerides: 118 mg/dL (ref <150)

## 2020-03-24 LAB — TSH: TSH: 1.17 mIU/L (ref 0.40–4.50)

## 2020-03-28 ENCOUNTER — Encounter: Payer: Self-pay | Admitting: Family Medicine

## 2020-03-28 DIAGNOSIS — E042 Nontoxic multinodular goiter: Secondary | ICD-10-CM | POA: Insufficient documentation

## 2020-03-28 DIAGNOSIS — E041 Nontoxic single thyroid nodule: Secondary | ICD-10-CM | POA: Insufficient documentation

## 2020-04-09 DIAGNOSIS — J3081 Allergic rhinitis due to animal (cat) (dog) hair and dander: Secondary | ICD-10-CM | POA: Diagnosis not present

## 2020-04-09 DIAGNOSIS — J3089 Other allergic rhinitis: Secondary | ICD-10-CM | POA: Diagnosis not present

## 2020-04-13 ENCOUNTER — Other Ambulatory Visit: Payer: Self-pay

## 2020-04-13 ENCOUNTER — Ambulatory Visit
Admission: RE | Admit: 2020-04-13 | Discharge: 2020-04-13 | Disposition: A | Discharge status: Home / Self Care | Payer: BC Managed Care – PPO | Source: Ambulatory Visit | Attending: Family Medicine | Admitting: Family Medicine

## 2020-04-13 DIAGNOSIS — E041 Nontoxic single thyroid nodule: Secondary | ICD-10-CM | POA: Diagnosis not present

## 2020-04-23 DIAGNOSIS — J3081 Allergic rhinitis due to animal (cat) (dog) hair and dander: Secondary | ICD-10-CM | POA: Diagnosis not present

## 2020-04-23 DIAGNOSIS — J3089 Other allergic rhinitis: Secondary | ICD-10-CM | POA: Diagnosis not present

## 2020-04-23 DIAGNOSIS — J301 Allergic rhinitis due to pollen: Secondary | ICD-10-CM | POA: Diagnosis not present

## 2020-05-07 DIAGNOSIS — J3081 Allergic rhinitis due to animal (cat) (dog) hair and dander: Secondary | ICD-10-CM | POA: Diagnosis not present

## 2020-05-07 DIAGNOSIS — J3089 Other allergic rhinitis: Secondary | ICD-10-CM | POA: Diagnosis not present

## 2020-05-18 DIAGNOSIS — J3081 Allergic rhinitis due to animal (cat) (dog) hair and dander: Secondary | ICD-10-CM | POA: Diagnosis not present

## 2020-05-18 DIAGNOSIS — H40023 Open angle with borderline findings, high risk, bilateral: Secondary | ICD-10-CM | POA: Diagnosis not present

## 2020-05-18 DIAGNOSIS — M25511 Pain in right shoulder: Secondary | ICD-10-CM | POA: Diagnosis not present

## 2020-05-18 DIAGNOSIS — J3089 Other allergic rhinitis: Secondary | ICD-10-CM | POA: Diagnosis not present

## 2020-05-19 DIAGNOSIS — J343 Hypertrophy of nasal turbinates: Secondary | ICD-10-CM | POA: Diagnosis not present

## 2020-05-19 DIAGNOSIS — H903 Sensorineural hearing loss, bilateral: Secondary | ICD-10-CM | POA: Diagnosis not present

## 2020-05-19 DIAGNOSIS — R438 Other disturbances of smell and taste: Secondary | ICD-10-CM | POA: Diagnosis not present

## 2020-05-19 DIAGNOSIS — J342 Deviated nasal septum: Secondary | ICD-10-CM | POA: Diagnosis not present

## 2020-05-31 DIAGNOSIS — J3081 Allergic rhinitis due to animal (cat) (dog) hair and dander: Secondary | ICD-10-CM | POA: Diagnosis not present

## 2020-05-31 DIAGNOSIS — J3089 Other allergic rhinitis: Secondary | ICD-10-CM | POA: Diagnosis not present

## 2020-06-02 DIAGNOSIS — J3089 Other allergic rhinitis: Secondary | ICD-10-CM | POA: Diagnosis not present

## 2020-06-02 DIAGNOSIS — J3081 Allergic rhinitis due to animal (cat) (dog) hair and dander: Secondary | ICD-10-CM | POA: Diagnosis not present

## 2020-06-08 DIAGNOSIS — J3081 Allergic rhinitis due to animal (cat) (dog) hair and dander: Secondary | ICD-10-CM | POA: Diagnosis not present

## 2020-06-08 DIAGNOSIS — J342 Deviated nasal septum: Secondary | ICD-10-CM | POA: Diagnosis not present

## 2020-06-08 DIAGNOSIS — R438 Other disturbances of smell and taste: Secondary | ICD-10-CM | POA: Diagnosis not present

## 2020-06-08 DIAGNOSIS — J3089 Other allergic rhinitis: Secondary | ICD-10-CM | POA: Diagnosis not present

## 2020-06-17 DIAGNOSIS — J3081 Allergic rhinitis due to animal (cat) (dog) hair and dander: Secondary | ICD-10-CM | POA: Diagnosis not present

## 2020-06-17 DIAGNOSIS — J3089 Other allergic rhinitis: Secondary | ICD-10-CM | POA: Diagnosis not present

## 2020-06-17 DIAGNOSIS — M1711 Unilateral primary osteoarthritis, right knee: Secondary | ICD-10-CM | POA: Diagnosis not present

## 2020-06-17 DIAGNOSIS — J301 Allergic rhinitis due to pollen: Secondary | ICD-10-CM | POA: Diagnosis not present

## 2020-06-24 DIAGNOSIS — J3081 Allergic rhinitis due to animal (cat) (dog) hair and dander: Secondary | ICD-10-CM | POA: Diagnosis not present

## 2020-06-24 DIAGNOSIS — J3089 Other allergic rhinitis: Secondary | ICD-10-CM | POA: Diagnosis not present

## 2020-06-24 DIAGNOSIS — M1711 Unilateral primary osteoarthritis, right knee: Secondary | ICD-10-CM | POA: Diagnosis not present

## 2020-06-29 IMAGING — US US THYROID
1 series · 13 of 25 positions shown · non-contrast
Comparison: None.

CLINICAL DATA: Enlarged thyroid on exam

EXAM:
THYROID ULTRASOUND
TECHNIQUE: Ultrasound examination of the thyroid gland and adjacent soft
tissues was performed.

[Series 1: us thyroid · 0.04mm/px · 13 of 51 slices shown]
[im 1/51]
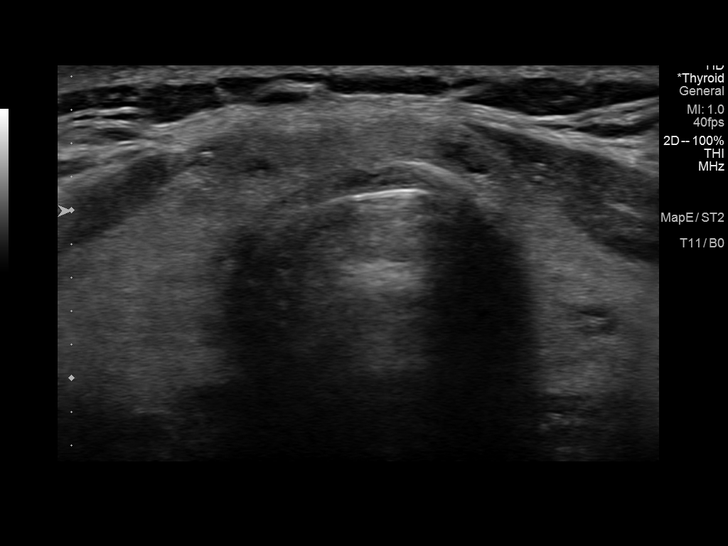
[im 5/51]
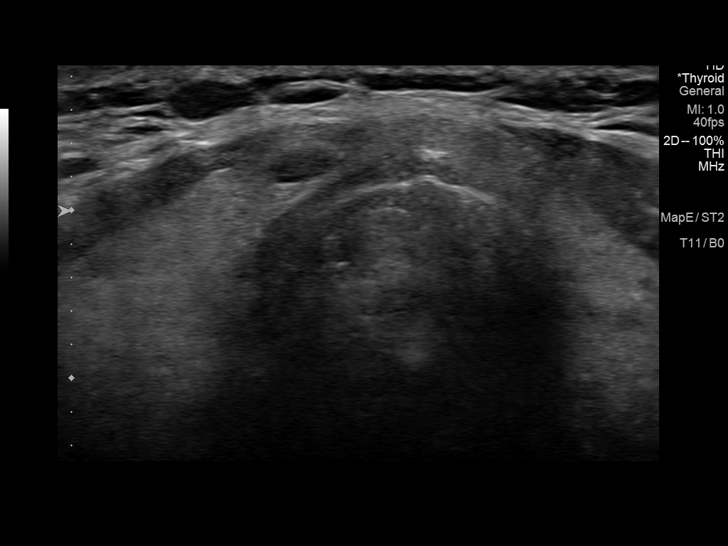
[im 9/51]
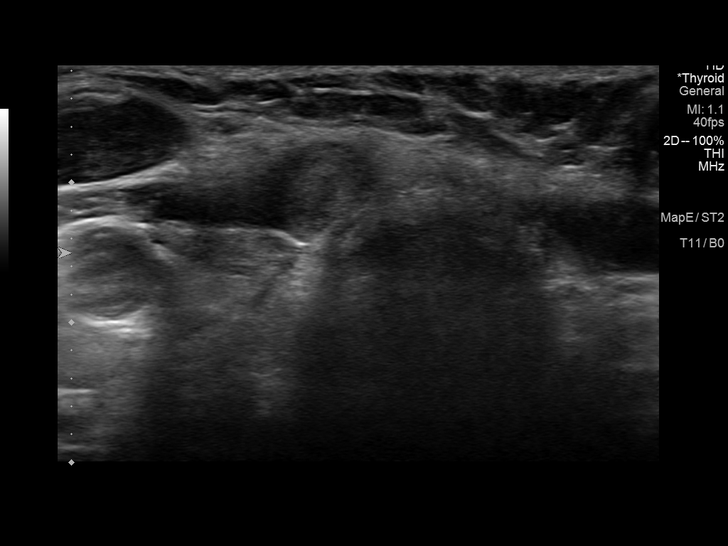
[im 13/51]
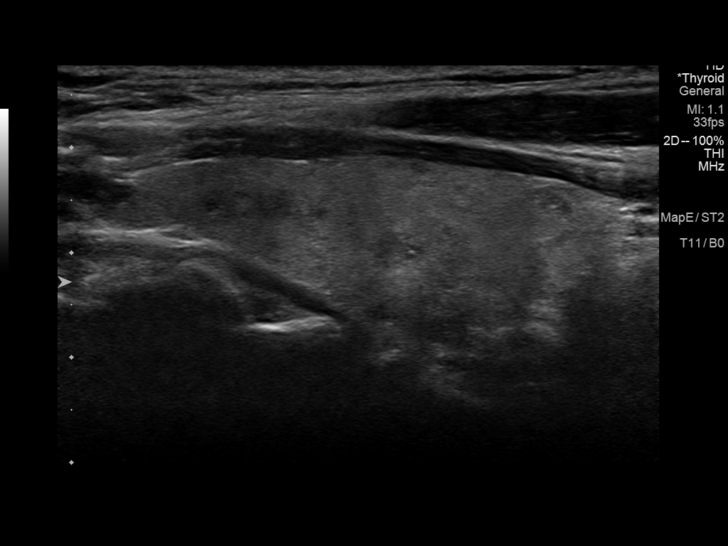
[im 17/51]
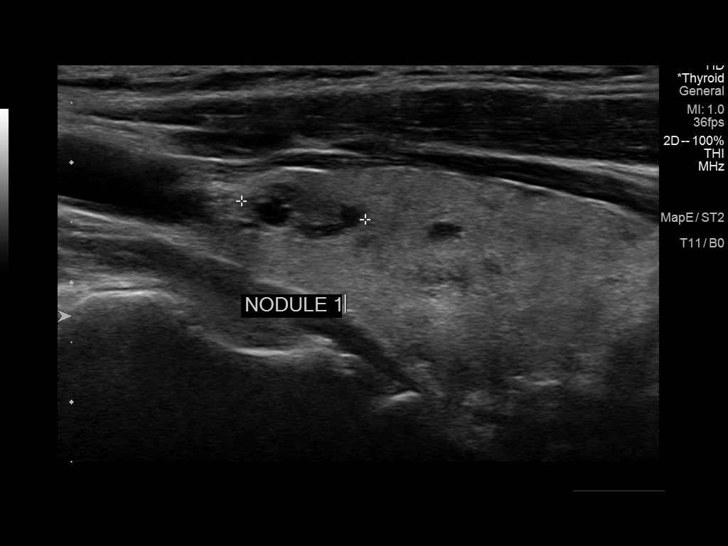
[im 21/51]
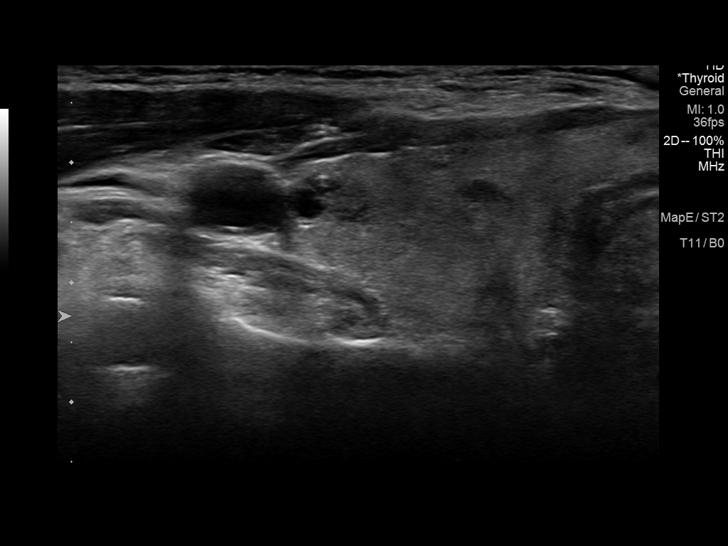
[im 26/51]
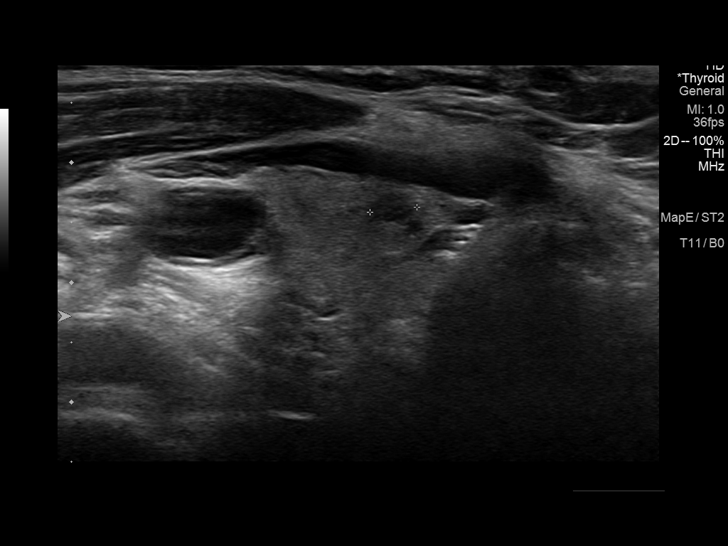
[im 30/51]
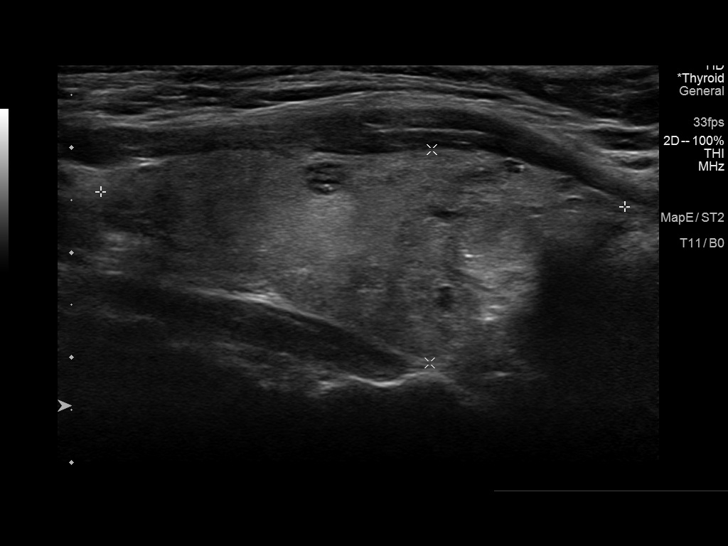
[im 34/51]
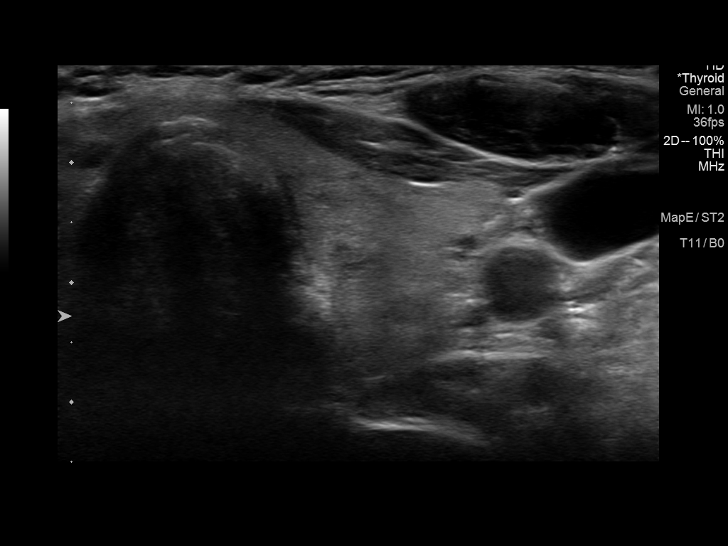
[im 38/51]
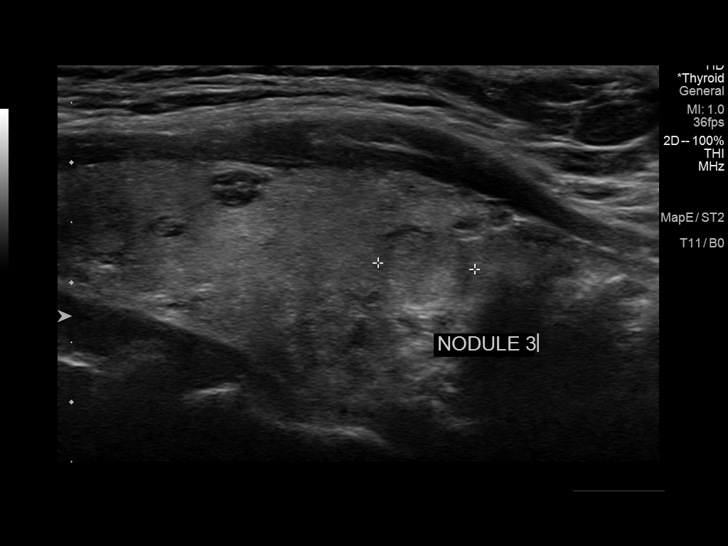
[im 42/51]
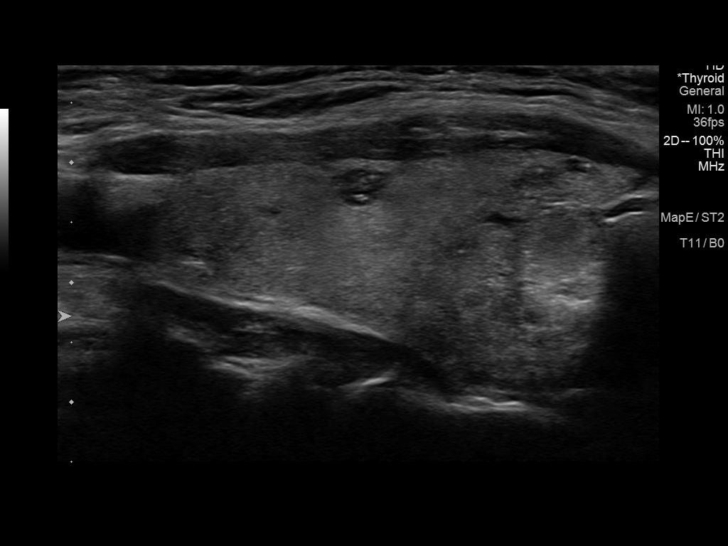
[im 46/51]
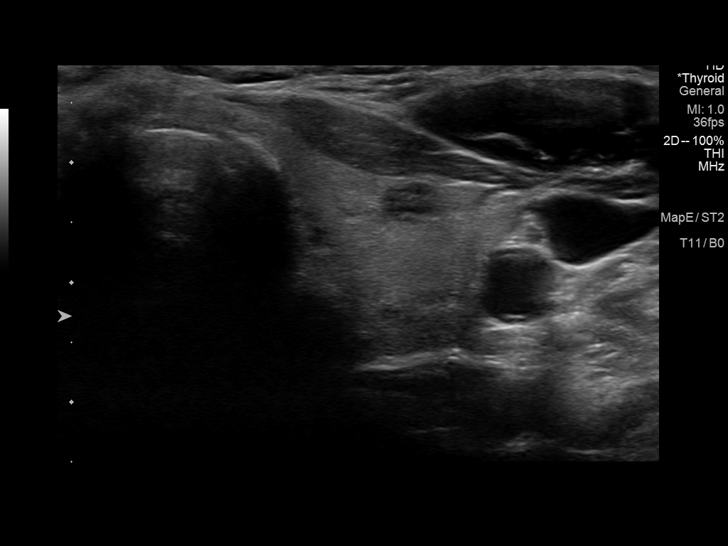
[im 51/51]
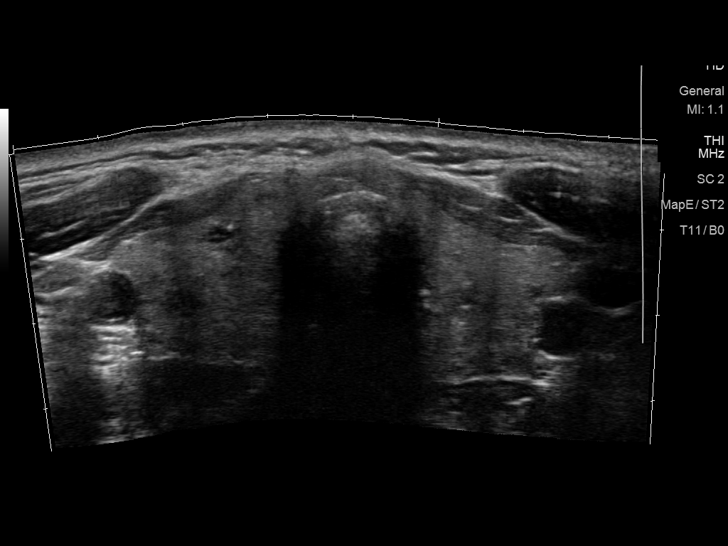

[13 of 25 positions shown; findings below may reference images not displayed]

FINDINGS: Parenchymal Echotexture: Mildly heterogenous

Isthmus: 3 mm

Right lobe: 5.0 x 1.8 x 2.1 cm

Left lobe: 5.0 x 2.0 x 1.9 cm

_________________________________________________________

Estimated total number of nodules >/= 1 cm: 2

Number of spongiform nodules >/=  2 cm not described below (TR1): 0

Number of mixed cystic and solid nodules >/= 1.5 cm not described
below (TR2): 0

_________________________________________________________

Nodule # 1:

Location: Right; Superior

Maximum size: 1.0 cm; Other 2 dimensions: 0.5 x 0.7 cm

Composition: mixed cystic and solid (1)

Echogenicity: hypoechoic (2)

Shape: not taller-than-wide (0)

Margins: ill-defined (0)

Echogenic foci: none (0)

ACR TI-RADS total points: 3.

ACR TI-RADS risk category: TR3 (3 points).

ACR TI-RADS recommendations:

Given size (<1.4 cm) and appearance, this nodule does NOT meet
TI-RADS criteria for biopsy or dedicated follow-up.

_________________________________________________________

Nodule # 2:

Location: Isthmus; right

Maximum size: 1.0 cm; Other 2 dimensions: 0.7 x 0.8 cm

Composition: solid/almost completely solid (2)

Echogenicity: hypoechoic (2)

Shape: not taller-than-wide (0)

Margins: ill-defined (0)

Echogenic foci: none (0)

ACR TI-RADS total points: 4.

ACR TI-RADS risk category: TR4 (4-6 points).

ACR TI-RADS recommendations:

*Given size (>/= 1 - 1.4 cm) and appearance, a follow-up ultrasound
in 1 year should be considered based on TI-RADS criteria.

_________________________________________________________

There are additional scattered subcentimeter hypoechoic and cystic
nodules noted bilaterally all measuring 8 mm or less in size. These
would not meet criteria for any biopsy or follow-up. Normal
vascularity. No regional adenopathy.
IMPRESSION: 1 cm right isthmus TR 4 nodule meets criteria for follow-up 1 year.
Additional findings as above.

The above is in keeping with the ACR TI-RADS recommendations - [HOSPITAL] 9414;[DATE].

## 2020-07-01 DIAGNOSIS — J3081 Allergic rhinitis due to animal (cat) (dog) hair and dander: Secondary | ICD-10-CM | POA: Diagnosis not present

## 2020-07-01 DIAGNOSIS — M1711 Unilateral primary osteoarthritis, right knee: Secondary | ICD-10-CM | POA: Diagnosis not present

## 2020-07-01 DIAGNOSIS — J3089 Other allergic rhinitis: Secondary | ICD-10-CM | POA: Diagnosis not present

## 2020-07-28 DIAGNOSIS — J3089 Other allergic rhinitis: Secondary | ICD-10-CM | POA: Diagnosis not present

## 2020-07-28 DIAGNOSIS — J3081 Allergic rhinitis due to animal (cat) (dog) hair and dander: Secondary | ICD-10-CM | POA: Diagnosis not present

## 2020-08-02 DIAGNOSIS — H00022 Hordeolum internum right lower eyelid: Secondary | ICD-10-CM | POA: Diagnosis not present

## 2020-08-02 DIAGNOSIS — J3089 Other allergic rhinitis: Secondary | ICD-10-CM | POA: Diagnosis not present

## 2020-08-02 DIAGNOSIS — J3081 Allergic rhinitis due to animal (cat) (dog) hair and dander: Secondary | ICD-10-CM | POA: Diagnosis not present

## 2020-08-11 DIAGNOSIS — D2262 Melanocytic nevi of left upper limb, including shoulder: Secondary | ICD-10-CM | POA: Diagnosis not present

## 2020-08-11 DIAGNOSIS — L821 Other seborrheic keratosis: Secondary | ICD-10-CM | POA: Diagnosis not present

## 2020-08-11 DIAGNOSIS — L718 Other rosacea: Secondary | ICD-10-CM | POA: Diagnosis not present

## 2020-08-11 DIAGNOSIS — L570 Actinic keratosis: Secondary | ICD-10-CM | POA: Diagnosis not present

## 2020-08-11 DIAGNOSIS — Z85828 Personal history of other malignant neoplasm of skin: Secondary | ICD-10-CM | POA: Diagnosis not present

## 2020-08-25 DIAGNOSIS — J3081 Allergic rhinitis due to animal (cat) (dog) hair and dander: Secondary | ICD-10-CM | POA: Diagnosis not present

## 2020-08-25 DIAGNOSIS — J3089 Other allergic rhinitis: Secondary | ICD-10-CM | POA: Diagnosis not present

## 2020-09-03 DIAGNOSIS — J3081 Allergic rhinitis due to animal (cat) (dog) hair and dander: Secondary | ICD-10-CM | POA: Diagnosis not present

## 2020-09-03 DIAGNOSIS — J3089 Other allergic rhinitis: Secondary | ICD-10-CM | POA: Diagnosis not present

## 2020-09-03 DIAGNOSIS — J301 Allergic rhinitis due to pollen: Secondary | ICD-10-CM | POA: Diagnosis not present

## 2020-09-29 DIAGNOSIS — J3089 Other allergic rhinitis: Secondary | ICD-10-CM | POA: Diagnosis not present

## 2020-09-29 DIAGNOSIS — J3081 Allergic rhinitis due to animal (cat) (dog) hair and dander: Secondary | ICD-10-CM | POA: Diagnosis not present

## 2020-10-06 DIAGNOSIS — J3089 Other allergic rhinitis: Secondary | ICD-10-CM | POA: Diagnosis not present

## 2020-10-06 DIAGNOSIS — J3081 Allergic rhinitis due to animal (cat) (dog) hair and dander: Secondary | ICD-10-CM | POA: Diagnosis not present

## 2020-10-15 DIAGNOSIS — J3089 Other allergic rhinitis: Secondary | ICD-10-CM | POA: Diagnosis not present

## 2020-10-15 DIAGNOSIS — J3081 Allergic rhinitis due to animal (cat) (dog) hair and dander: Secondary | ICD-10-CM | POA: Diagnosis not present

## 2020-10-22 DIAGNOSIS — J3089 Other allergic rhinitis: Secondary | ICD-10-CM | POA: Diagnosis not present

## 2020-10-22 DIAGNOSIS — J3081 Allergic rhinitis due to animal (cat) (dog) hair and dander: Secondary | ICD-10-CM | POA: Diagnosis not present

## 2020-10-28 DIAGNOSIS — J3089 Other allergic rhinitis: Secondary | ICD-10-CM | POA: Diagnosis not present

## 2020-10-28 DIAGNOSIS — J3081 Allergic rhinitis due to animal (cat) (dog) hair and dander: Secondary | ICD-10-CM | POA: Diagnosis not present

## 2020-11-04 DIAGNOSIS — J3081 Allergic rhinitis due to animal (cat) (dog) hair and dander: Secondary | ICD-10-CM | POA: Diagnosis not present

## 2020-11-04 DIAGNOSIS — J3089 Other allergic rhinitis: Secondary | ICD-10-CM | POA: Diagnosis not present

## 2020-11-17 DIAGNOSIS — J342 Deviated nasal septum: Secondary | ICD-10-CM | POA: Diagnosis not present

## 2020-11-17 DIAGNOSIS — J310 Chronic rhinitis: Secondary | ICD-10-CM | POA: Diagnosis not present

## 2020-11-17 DIAGNOSIS — H903 Sensorineural hearing loss, bilateral: Secondary | ICD-10-CM | POA: Diagnosis not present

## 2020-11-17 DIAGNOSIS — J343 Hypertrophy of nasal turbinates: Secondary | ICD-10-CM | POA: Diagnosis not present

## 2020-11-18 DIAGNOSIS — J3081 Allergic rhinitis due to animal (cat) (dog) hair and dander: Secondary | ICD-10-CM | POA: Diagnosis not present

## 2020-11-18 DIAGNOSIS — J3089 Other allergic rhinitis: Secondary | ICD-10-CM | POA: Diagnosis not present

## 2020-11-25 DIAGNOSIS — J3089 Other allergic rhinitis: Secondary | ICD-10-CM | POA: Diagnosis not present

## 2020-11-25 DIAGNOSIS — J3081 Allergic rhinitis due to animal (cat) (dog) hair and dander: Secondary | ICD-10-CM | POA: Diagnosis not present

## 2020-12-08 DIAGNOSIS — J3081 Allergic rhinitis due to animal (cat) (dog) hair and dander: Secondary | ICD-10-CM | POA: Diagnosis not present

## 2020-12-08 DIAGNOSIS — J3089 Other allergic rhinitis: Secondary | ICD-10-CM | POA: Diagnosis not present

## 2020-12-09 DIAGNOSIS — J3089 Other allergic rhinitis: Secondary | ICD-10-CM | POA: Diagnosis not present

## 2020-12-09 DIAGNOSIS — J3081 Allergic rhinitis due to animal (cat) (dog) hair and dander: Secondary | ICD-10-CM | POA: Diagnosis not present

## 2020-12-21 DIAGNOSIS — J3089 Other allergic rhinitis: Secondary | ICD-10-CM | POA: Diagnosis not present

## 2020-12-21 DIAGNOSIS — J3081 Allergic rhinitis due to animal (cat) (dog) hair and dander: Secondary | ICD-10-CM | POA: Diagnosis not present

## 2020-12-29 DIAGNOSIS — J3081 Allergic rhinitis due to animal (cat) (dog) hair and dander: Secondary | ICD-10-CM | POA: Diagnosis not present

## 2020-12-29 DIAGNOSIS — J301 Allergic rhinitis due to pollen: Secondary | ICD-10-CM | POA: Diagnosis not present

## 2021-01-05 DIAGNOSIS — J3089 Other allergic rhinitis: Secondary | ICD-10-CM | POA: Diagnosis not present

## 2021-01-05 DIAGNOSIS — J3081 Allergic rhinitis due to animal (cat) (dog) hair and dander: Secondary | ICD-10-CM | POA: Diagnosis not present

## 2021-01-21 DIAGNOSIS — J3089 Other allergic rhinitis: Secondary | ICD-10-CM | POA: Diagnosis not present

## 2021-01-21 DIAGNOSIS — J3081 Allergic rhinitis due to animal (cat) (dog) hair and dander: Secondary | ICD-10-CM | POA: Diagnosis not present

## 2021-01-28 DIAGNOSIS — J3089 Other allergic rhinitis: Secondary | ICD-10-CM | POA: Diagnosis not present

## 2021-01-28 DIAGNOSIS — J3081 Allergic rhinitis due to animal (cat) (dog) hair and dander: Secondary | ICD-10-CM | POA: Diagnosis not present

## 2021-02-04 DIAGNOSIS — J3081 Allergic rhinitis due to animal (cat) (dog) hair and dander: Secondary | ICD-10-CM | POA: Diagnosis not present

## 2021-02-04 DIAGNOSIS — J3089 Other allergic rhinitis: Secondary | ICD-10-CM | POA: Diagnosis not present

## 2021-02-08 DIAGNOSIS — J301 Allergic rhinitis due to pollen: Secondary | ICD-10-CM | POA: Diagnosis not present

## 2021-02-08 DIAGNOSIS — R438 Other disturbances of smell and taste: Secondary | ICD-10-CM | POA: Diagnosis not present

## 2021-02-08 DIAGNOSIS — J3089 Other allergic rhinitis: Secondary | ICD-10-CM | POA: Diagnosis not present

## 2021-02-08 DIAGNOSIS — J342 Deviated nasal septum: Secondary | ICD-10-CM | POA: Diagnosis not present

## 2021-02-08 DIAGNOSIS — J3081 Allergic rhinitis due to animal (cat) (dog) hair and dander: Secondary | ICD-10-CM | POA: Diagnosis not present

## 2021-02-18 DIAGNOSIS — J3089 Other allergic rhinitis: Secondary | ICD-10-CM | POA: Diagnosis not present

## 2021-02-18 DIAGNOSIS — J3081 Allergic rhinitis due to animal (cat) (dog) hair and dander: Secondary | ICD-10-CM | POA: Diagnosis not present

## 2021-03-05 DIAGNOSIS — U071 COVID-19: Secondary | ICD-10-CM | POA: Diagnosis not present

## 2021-03-07 ENCOUNTER — Telehealth: Payer: Self-pay

## 2021-03-07 NOTE — Telephone Encounter (Signed)
Caller has tested positive for COVID and is wanting and antiviral called in. -Caller states COVID positive with symptoms starting yesterday afternoon. Having some nasal congestion and scratchy throat.  03/05/2021 11:33:45 AM Call PCP within 24 Hours Yes Lavina Hamman, RN, [facility]  Comments User: Farrel Gobble, RN Date/Time Eilene Ghazi Time): 03/05/2021 11:34:09 AM 98% [id number]  User: Farrel Gobble, RN Date/Time Eilene Ghazi Time): 03/05/2021 11:42:58 AM no meds after hours, so I did not page on-call and instead recommended patient be seen at [facility] in the next 24 hours.  Referrals GO TO FACILITY OTHER - SPECIFY  03/07/21 1215: LVM notifying pt that this RN was calling to check on her status.

## 2021-03-16 DIAGNOSIS — Z01419 Encounter for gynecological examination (general) (routine) without abnormal findings: Secondary | ICD-10-CM | POA: Diagnosis not present

## 2021-03-16 DIAGNOSIS — R3129 Other microscopic hematuria: Secondary | ICD-10-CM | POA: Diagnosis not present

## 2021-03-16 DIAGNOSIS — R319 Hematuria, unspecified: Secondary | ICD-10-CM | POA: Diagnosis not present

## 2021-03-16 DIAGNOSIS — Z1231 Encounter for screening mammogram for malignant neoplasm of breast: Secondary | ICD-10-CM | POA: Diagnosis not present

## 2021-03-16 DIAGNOSIS — Z6826 Body mass index (BMI) 26.0-26.9, adult: Secondary | ICD-10-CM | POA: Diagnosis not present

## 2021-03-29 ENCOUNTER — Encounter: Payer: BC Managed Care – PPO | Admitting: Family Medicine

## 2021-03-29 NOTE — Progress Notes (Deleted)
HPI: [name] is a 70 y.o. female, who is here today for her routine physical.  Last CPE: 03/23/20  Regular exercise 3 or more time per week: *** Following a healthy diet: *** She lives with ***  Chronic medical problems: ***  Immunization History  Administered Date(s) Administered   Fluad Quad(high Dose 65+) 03/23/2020   Influenza, High Dose Seasonal PF 12/08/2016, 04/27/2018   Influenza-Unspecified 02/21/2017, 01/18/2019   PFIZER(Purple Top)SARS-COV-2 Vaccination 04/14/2019, 05/05/2019   Pfizer Covid-19 Vaccine Bivalent Booster 65yrs & up 01/31/2021   Pneumococcal Conjugate-13 12/08/2016   Pneumococcal Polysaccharide-23 10/18/2018   Health Maintenance  Topic Date Due   Zoster Vaccines- Shingrix (1 of 2) Never done   MAMMOGRAM  10/31/2019   INFLUENZA VACCINE  11/01/2020   COLONOSCOPY (Pts 45-27yrs Insurance coverage will need to be confirmed)  06/04/2023   TETANUS/TDAP  12/09/2026   Pneumonia Vaccine 41+ Years old  Completed   DEXA SCAN  Completed   COVID-19 Vaccine  Completed   Hepatitis C Screening  Completed   HPV VACCINES  Aged Out    She has *** concerns today.  Review of Systems  Current Outpatient Medications on File Prior to Visit  Medication Sig Dispense Refill   betamethasone dipropionate (DIPROLENE) 0.05 % cream Apply 1 application daily as needed topically (rash on elbow).      Calcium Carb-Cholecalciferol (CALCIUM + D3) 600-200 MG-UNIT TABS Take 1 tablet by mouth daily.     docusate sodium (STOOL SOFTENER) 100 MG capsule Take 100 mg at bedtime by mouth.      EPINEPHrine 0.3 mg/0.3 mL IJ SOAJ injection epinephrine 0.3 mg/0.3 mL injection, auto-injector     estradiol (ESTRACE) 0.1 MG/GM vaginal cream Place 0.5 g vaginally 2 (two) times a week.     Fexofenadine HCl (ALLEGRA ALLERGY PO) Allegra Allergy     fluticasone (FLONASE) 50 MCG/ACT nasal spray Place 1 spray into both nostrils 2 (two) times daily. 16 g 0   ibuprofen (ADVIL,MOTRIN) 200 MG  tablet Take 200 mg at bedtime by mouth.      METRONIDAZOLE, TOPICAL, 0.75 % LOTN APPLY  THIN LAYER TO FACE ONCE DAILY AS NEEDED FOR ROSCEA  2   PREVIDENT 5000 BOOSTER PLUS 1.1 % PSTE      Probiotic Product (PROBIOTIC PO) Take 1 tablet by mouth daily.     No current facility-administered medications on file prior to visit.     Past Medical History:  Diagnosis Date   Allergy    Arthritis    Cancer ([place])    basal cell skin cancer    Past Surgical History:  Procedure Laterality Date   CESAREAN SECTION     x3   CHOLECYSTECTOMY     LIPOMA EXCISION N/A 02/14/2017   Procedure: EXCISION POSTERIOR NECK CYST;  Surgeon: Coralie Keens, MD;  Location: [place];  Service: General;  Laterality: N/A;   NASAL RECONSTRUCTION     PARTIAL HYSTERECTOMY     TONSILLECTOMY      Allergies  Allergen Reactions   Cat Hair Extract Itching   Other Itching   Cephalexin Rash   Adhesive [Tape] Other (See Comments)    Rash/ reddness    Family History  Problem Relation Age of Onset   Hypertension Mother    Heart disease Father        CAD   Hypertension Father    Colon cancer Neg Hx     Social History   Socioeconomic History   Marital status: Married  Spouse name: Not on file   Number of children: Not on file   Years of education: Not on file   Highest education level: Not on file  Occupational History   Not on file  Tobacco Use   Smoking status: Former   Smokeless tobacco: Never   Tobacco comments:    quit 30 + years ago  Vaping Use   Vaping Use: Former  Substance and Sexual Activity   Alcohol use: No   Drug use: No   Sexual activity: Yes    Partners: Male  Other Topics Concern   Not on file  Social History Narrative   Not on file   Social Determinants of Health   Financial Resource Strain: Not on file  Food Insecurity: Not on file  Transportation Needs: Not on file  Physical Activity: Not on file  Stress: Not on file  Social Connections: Not on file    There were  no vitals filed for this visit. There is no height or weight on file to calculate BMI.  Wt Readings from Last 3 Encounters:  03/23/20 156 lb (70.8 kg)  09/16/19 154 lb 4 oz (70 kg)  06/11/19 157 lb 6.4 oz (71.4 kg)     Physical Exam  ASSESSMENT AND PLAN:  Ms. Stephanie Henry was here today annual physical examination.  No orders of the defined types were placed in this encounter.   Diagnoses and all orders for this visit:  Hyperlipidemia, unspecified hyperlipidemia type  Thyroid nodule    No problem-specific Assessment & Plan notes found for this encounter.   No follow-ups on file.  Jaran Sainz G. Martinique, MD  [facility]. [place] office.

## 2021-04-05 NOTE — Progress Notes (Signed)
HPI: Stephanie Henry is a 71 y.o. female, who is here today for her routine physical.  Last CPE: 03/23/20  Regular exercise 3 or more time per week: Started walking a few times per week, 3 usually for 30-40 min. Following a healthful diet: She does well most of the time.  Chronic medical problems: Thyroid nodule,HLD,hearing loss,and allergic rhinitis among some. Follows with immunologist regularly.  Immunization History  Administered Date(s) Administered   Fluad Quad(high Dose 65+) 03/23/2020   Influenza, High Dose Seasonal PF 01/17/2016, 12/08/2016, 04/27/2018, 01/07/2019, 04/29/2019, 06/08/2020   Influenza-Unspecified 02/21/2017, 01/18/2019, 01/01/2021   PFIZER(Purple Top)SARS-COV-2 Vaccination 04/14/2019, 05/05/2019, 11/19/2019, 06/08/2020   Pfizer Covid-19 Vaccine Bivalent Booster 37yrs & up 01/31/2021   Pneumococcal Conjugate-13 12/08/2016   Pneumococcal Polysaccharide-23 10/18/2018, 04/29/2019, 06/08/2020   Zoster Recombinat (Shingrix) 04/06/2021   Health Maintenance  Topic Date Due   Zoster Vaccines- Shingrix (2 of 2) 06/01/2021   MAMMOGRAM  03/04/2023   COLONOSCOPY (Pts 45-59yrs Insurance coverage will need to be confirmed)  06/04/2023   TETANUS/TDAP  12/09/2026   Pneumonia Vaccine 36+ Years old  Completed   INFLUENZA VACCINE  Completed   DEXA SCAN  Completed   COVID-19 Vaccine  Completed   Hepatitis C Screening  Completed   HPV VACCINES  Aged Out   Follows with gyn regularly,last visit in 03/2021. Blood in urine found at her gyn's office. Urinary frequency stable for a while. Negative for dysuria and gross hematuria.  Thyroid nodule: 04/13/2020 thyroid NW:GNFAOZ 1 cm TR 4 thyroid nodule in the isthmus. Yearly ultrasound follow-up is recommended to document 5 years of stability.   Lab Results  Component Value Date   TSH 1.17 03/23/2020   HLD: On non pharmacologic treatment.  Lab Results  Component Value Date   CHOL 206 (H) 03/23/2020   HDL 50  03/23/2020   LDLCALC 133 (H) 03/23/2020   TRIG 118 03/23/2020   CHOLHDL 4.1 03/23/2020   Review of Systems  Constitutional:  Negative for appetite change and fever.  HENT:  Negative for hearing loss, mouth sores, sore throat and trouble swallowing.   Eyes:  Negative for redness and visual disturbance.  Respiratory:  Negative for cough, shortness of breath and wheezing.   Cardiovascular:  Negative for chest pain and leg swelling.  Gastrointestinal:  Negative for abdominal pain, nausea and vomiting.       No changes in bowel habits.  Endocrine: Negative for cold intolerance, heat intolerance, polydipsia, polyphagia and polyuria.  Genitourinary:  Negative for decreased urine volume, vaginal bleeding and vaginal discharge.  Musculoskeletal:  Negative for arthralgias, gait problem and myalgias.  Skin:  Negative for color change and rash.  Allergic/Immunologic: Positive for environmental allergies.  Neurological:  Negative for syncope, weakness and headaches.  Hematological:  Negative for adenopathy. Does not bruise/bleed easily.  Psychiatric/Behavioral:  Negative for confusion.   All other systems reviewed and are negative.  Current Outpatient Medications on File Prior to Visit  Medication Sig Dispense Refill   betamethasone dipropionate (DIPROLENE) 0.05 % cream Apply 1 application daily as needed topically (rash on elbow).      Calcium Carb-Cholecalciferol (CALCIUM + D3) 600-200 MG-UNIT TABS Take 1 tablet by mouth daily.     docusate sodium (COLACE) 100 MG capsule Take 100 mg by mouth at bedtime.     EPINEPHrine 0.3 mg/0.3 mL IJ SOAJ injection epinephrine 0.3 mg/0.3 mL injection, auto-injector     estradiol (ESTRACE) 0.1 MG/GM vaginal cream Place 0.5 g vaginally 2 (two) times a  week.     Fexofenadine HCl (ALLEGRA ALLERGY PO) Allegra Allergy     fluticasone (FLONASE) 50 MCG/ACT nasal spray Place 1 spray into both nostrils 2 (two) times daily. 16 g 0   ibuprofen (ADVIL,MOTRIN) 200 MG tablet  Take 200 mg at bedtime by mouth.      METRONIDAZOLE, TOPICAL, 0.75 % LOTN APPLY  THIN LAYER TO FACE ONCE DAILY AS NEEDED FOR ROSCEA  2   PREVIDENT 5000 BOOSTER PLUS 1.1 % PSTE      Probiotic Product (PROBIOTIC PO) Take 1 tablet by mouth daily.     No current facility-administered medications on file prior to visit.   Past Medical History:  Diagnosis Date   Allergy    Arthritis    Cancer ([place])    basal cell skin cancer   Past Surgical History:  Procedure Laterality Date   CESAREAN SECTION     x3   CHOLECYSTECTOMY     LIPOMA EXCISION N/A 02/14/2017   Procedure: EXCISION POSTERIOR NECK CYST;  Surgeon: Coralie Keens, MD;  Location: [facility];  Service: General;  Laterality: N/A;   NASAL RECONSTRUCTION     PARTIAL HYSTERECTOMY     TONSILLECTOMY     Allergies  Allergen Reactions   Cat Hair Extract Itching   Other Itching   Cephalexin Rash   Adhesive [Tape] Other (See Comments)    Rash/ reddness   Family History  Problem Relation Age of Onset   Hypertension Mother    Heart disease Father        CAD   Hypertension Father    Colon cancer Neg Hx    Social History   Socioeconomic History   Marital status: Married    Spouse name: Not on file   Number of children: Not on file   Years of education: Not on file   Highest education level: Not on file  Occupational History   Not on file  Tobacco Use   Smoking status: Former   Smokeless tobacco: Never   Tobacco comments:    quit 30 + years ago  Vaping Use   Vaping Use: Former  Substance and Sexual Activity   Alcohol use: No   Drug use: No   Sexual activity: Yes    Partners: Male  Other Topics Concern   Not on file  Social History Narrative   Not on file   Social Determinants of Health   Financial Resource Strain: Not on file  Food Insecurity: Not on file  Transportation Needs: Not on file  Physical Activity: Not on file  Stress: Not on file  Social Connections: Not on file   Vitals:   04/06/21 0829  BP:  126/70  Pulse: 80  Resp: 16  Temp: 98.5 F (36.9 C)  SpO2: 97%   Body mass index is 26.22 kg/m.  Wt Readings from Last 3 Encounters:  04/06/21 155 lb 2 oz (70.4 kg)  03/23/20 156 lb (70.8 kg)  09/16/19 154 lb 4 oz (70 kg)   Physical Exam Vitals and nursing note reviewed.  Constitutional:      General: She is not in acute distress.    Appearance: She is well-developed.  HENT:     Head: Normocephalic and atraumatic.     Right Ear: Hearing, tympanic membrane, ear canal and external ear normal.     Left Ear: Hearing, tympanic membrane, ear canal and external ear normal.     Mouth/Throat:     Mouth: Mucous membranes are moist.  Pharynx: Oropharynx is clear. Uvula midline.  Eyes:     Extraocular Movements: Extraocular movements intact.     Conjunctiva/sclera: Conjunctivae normal.     Pupils: Pupils are equal, round, and reactive to light.  Neck:     Thyroid: No thyroid mass.     Trachea: No tracheal deviation.  Cardiovascular:     Rate and Rhythm: Normal rate and regular rhythm.     Pulses:          Dorsalis pedis pulses are 2+ on the right side and 2+ on the left side.     Heart sounds: No murmur heard.    Comments: Varicose veins LE, bilateral. Pulmonary:     Effort: Pulmonary effort is normal. No respiratory distress.     Breath sounds: Normal breath sounds.  Abdominal:     Palpations: Abdomen is soft. There is no hepatomegaly or mass.     Tenderness: There is no abdominal tenderness.  Genitourinary:    Comments: Deferred to gyn. Musculoskeletal:     Right lower leg: No edema.     Left lower leg: No edema.     Comments: No major deformity or signs of synovitis appreciated.  Lymphadenopathy:     Cervical: No cervical adenopathy.  Skin:    General: Skin is warm.     Findings: No erythema or rash.  Neurological:     General: No focal deficit present.     Mental Status: She is alert and oriented to person, place, and time.     Cranial Nerves: No cranial nerve  deficit.     Coordination: Coordination normal.     Gait: Gait normal.     Deep Tendon Reflexes:     Reflex Scores:      Bicep reflexes are 2+ on the right side and 2+ on the left side.      Patellar reflexes are 2+ on the right side and 2+ on the left side. Psychiatric:        Speech: Speech normal.     Comments: Well groomed, good eye contact.   ASSESSMENT AND PLAN:  Stephanie Henry was here today annual physical examination.  Orders Placed This Encounter  Procedures   US THYROID   Varicella-zoster vaccine IM   Urinalysis, Routine w reflex microscopic   Lipid panel   TSH   Comprehensive metabolic panel   Lab Results  Component Value Date   CREATININE 0.72 04/06/2021   BUN 19 04/06/2021   NA 141 04/06/2021   K 3.9 04/06/2021   CL 106 04/06/2021   CO2 29 04/06/2021   Lab Results  Component Value Date   ALT 15 04/06/2021   AST 14 04/06/2021   ALKPHOS 45 04/06/2021   BILITOT 0.4 04/06/2021   Lab Results  Component Value Date   TSH 1.38 04/06/2021   Lab Results  Component Value Date   CHOL 202 (H) 04/06/2021   HDL 45.00 04/06/2021   LDLCALC 137 (H) 04/06/2021   TRIG 101.0 04/06/2021   CHOLHDL 4 04/06/2021   Routine general medical examination at a health care facility We discussed the importance of regular physical activity and healthy diet for prevention of chronic illness and/or complications. Preventive guidelines reviewed. Vaccination updated. Continue her female preventive care with her gynecologist. Ca++ and vit D supplementation recommended. Next CPE in a year.  The 10-year ASCVD risk score (Arnett DK, et al., 2019) is: 8.7%   Values used to calculate the score:     Age:  79 years     Sex: Female     Is Non-Hispanic African American: No     Diabetic: No     Tobacco smoker: No     Systolic Blood Pressure: 672 mmHg     Is BP treated: No     HDL Cholesterol: 45 mg/dL     Total Cholesterol: 202 mg/dL  Asymptomatic microscopic  hematuria Reporting abnormal UA at her gyn's office. Possible etiologies discussed. Further recommendations according to UA result.  Need for shingles vaccine -     Varicella-zoster vaccine IM  Hyperlipidemia Non pharmacologic treatment recommended for now. Further recommendations will be given according to 10 years CVD risk score and lipid panel numbers.  Thyroid nodule Will continue annual thyroid US until 2026 and annual TSH.   Return in 1 year (on 04/06/2022) for CPE.  Jep Dyas G. Martinique, MD  [facility]. [place] office.

## 2021-04-06 ENCOUNTER — Ambulatory Visit (INDEPENDENT_AMBULATORY_CARE_PROVIDER_SITE_OTHER): Payer: BC Managed Care – PPO | Admitting: Family Medicine

## 2021-04-06 ENCOUNTER — Encounter: Payer: Self-pay | Admitting: Family Medicine

## 2021-04-06 VITALS — BP 126/70 | HR 80 | Temp 98.5°F | Resp 16 | Ht 64.5 in | Wt 155.1 lb

## 2021-04-06 DIAGNOSIS — Z0000 Encounter for general adult medical examination without abnormal findings: Secondary | ICD-10-CM

## 2021-04-06 DIAGNOSIS — R3121 Asymptomatic microscopic hematuria: Secondary | ICD-10-CM | POA: Diagnosis not present

## 2021-04-06 DIAGNOSIS — Z23 Encounter for immunization: Secondary | ICD-10-CM

## 2021-04-06 DIAGNOSIS — E041 Nontoxic single thyroid nodule: Secondary | ICD-10-CM | POA: Diagnosis not present

## 2021-04-06 DIAGNOSIS — E785 Hyperlipidemia, unspecified: Secondary | ICD-10-CM | POA: Diagnosis not present

## 2021-04-06 LAB — URINALYSIS, ROUTINE W REFLEX MICROSCOPIC
Bilirubin Urine: NEGATIVE
Ketones, ur: NEGATIVE
Leukocytes,Ua: NEGATIVE
Nitrite: NEGATIVE
Specific Gravity, Urine: >=1.03 — AB (ref 1.000–1.030)
Total Protein, Urine: NEGATIVE
Urine Glucose: NEGATIVE
Urobilinogen, UA: 0.2 (ref 0.0–1.0)
pH: 5.5 (ref 5.0–8.0)

## 2021-04-06 LAB — COMPREHENSIVE METABOLIC PANEL
ALT: 15 U/L (ref 0–35)
AST: 14 U/L (ref 0–37)
Albumin: 4.1 g/dL (ref 3.5–5.2)
Alkaline Phosphatase: 45 U/L (ref 39–117)
BUN: 19 mg/dL (ref 6–23)
CO2: 29 mEq/L (ref 19–32)
Calcium: 9.3 mg/dL (ref 8.4–10.5)
Chloride: 106 mEq/L (ref 96–112)
Creatinine, Ser: 0.72 mg/dL (ref 0.40–1.20)
GFR: 85.26 mL/min (ref >60.00)
Glucose, Bld: 104 mg/dL — ABNORMAL HIGH (ref 70–99)
Potassium: 3.9 mEq/L (ref 3.5–5.1)
Sodium: 141 mEq/L (ref 135–145)
Total Bilirubin: 0.4 mg/dL (ref 0.2–1.2)
Total Protein: 6.7 g/dL (ref 6.0–8.3)

## 2021-04-06 LAB — LIPID PANEL
Cholesterol: 202 mg/dL — ABNORMAL HIGH (ref 0–200)
HDL: 45 mg/dL (ref >39.00)
LDL Cholesterol: 137 mg/dL — ABNORMAL HIGH (ref 0–99)
NonHDL: 156.82
Total CHOL/HDL Ratio: 4
Triglycerides: 101 mg/dL (ref 0.0–149.0)
VLDL: 20.2 mg/dL (ref 0.0–40.0)

## 2021-04-06 LAB — TSH: TSH: 1.38 u[IU]/mL (ref 0.35–5.50)

## 2021-04-06 NOTE — Patient Instructions (Addendum)
A few things to remember from today's visit:  Routine general medical examination at a health care facility  Hyperlipidemia, unspecified hyperlipidemia type - Plan: Lipid panel, Comprehensive metabolic panel  Thyroid nodule - Plan: US THYROID, TSH  Asymptomatic microscopic hematuria - Plan: Urinalysis, Routine w reflex microscopic  Do not use My Chart to request refills or for acute issues that need immediate attention.  Please be sure medication list is accurate. If a new problem present, please set up appointment sooner than planned today. Preventive Care 55 Years and Older, Female Preventive care refers to lifestyle choices and visits with your health care provider that can promote health and wellness. Preventive care visits are also called wellness exams. What can I expect for my preventive care visit? Counseling Your health care provider may ask you questions about your: Medical history, including: Past medical problems. Family medical history. Pregnancy and menstrual history. History of falls. Current health, including: Memory and ability to understand (cognition). Emotional well-being. Home life and relationship well-being. Sexual activity and sexual health. Lifestyle, including: Alcohol, nicotine or tobacco, and drug use. Access to firearms. Diet, exercise, and sleep habits. Work and work Statistician. Sunscreen use. Safety issues such as seatbelt and bike helmet use. Physical exam Your health care provider will check your: Height and weight. These may be used to calculate your BMI (body mass index). BMI is a measurement that tells if you are at a healthy weight. Waist circumference. This measures the distance around your waistline. This measurement also tells if you are at a healthy weight and may help predict your risk of certain diseases, such as type 2 diabetes and high blood pressure. Heart rate and blood pressure. Body temperature. Skin for abnormal spots. What  immunizations do I need? Vaccines are usually given at various ages, according to a schedule. Your health care provider will recommend vaccines for you based on your age, medical history, and lifestyle or other factors, such as travel or where you work. What tests do I need? Screening Your health care provider may recommend screening tests for certain conditions. This may include: Lipid and cholesterol levels. Hepatitis C test. Hepatitis B test. HIV (human immunodeficiency virus) test. STI (sexually transmitted infection) testing, if you are at risk. Lung cancer screening. Colorectal cancer screening. Diabetes screening. This is done by checking your blood sugar (glucose) after you have not eaten for a while (fasting). Mammogram. Talk with your health care provider about how often you should have regular mammograms. BRCA-related cancer screening. This may be done if you have a family history of breast, ovarian, tubal, or peritoneal cancers. Bone density scan. This is done to screen for osteoporosis. Talk with your health care provider about your test results, treatment options, and if necessary, the need for more tests. Follow these instructions at home: Eating and drinking  Eat a diet that includes fresh fruits and vegetables, whole grains, lean protein, and low-fat dairy products. Limit your intake of foods with high amounts of sugar, saturated fats, and salt. Take vitamin and mineral supplements as recommended by your health care provider. Do not drink alcohol if your health care provider tells you not to drink. If you drink alcohol: Limit how much you have to 0-1 drink a day. Know how much alcohol is in your drink. In the [place] one drink equals one 12 oz bottle of beer (355 mL), one 5 oz glass of wine (148 mL), or one 1 oz glass of hard liquor (44 mL). Lifestyle Brush your teeth every  morning and night with fluoride toothpaste. Floss one time each day. Exercise for at least 30  minutes 5 or more days each week. Do not use any products that contain nicotine or tobacco. These products include cigarettes, chewing tobacco, and vaping devices, such as e-cigarettes. If you need help quitting, ask your health care provider. Do not use drugs. If you are sexually active, practice safe sex. Use a condom or other form of protection in order to prevent STIs. Take aspirin only as told by your health care provider. Make sure that you understand how much to take and what form to take. Work with your health care provider to find out whether it is safe and beneficial for you to take aspirin daily. Ask your health care provider if you need to take a cholesterol-lowering medicine (statin). Find healthy ways to manage stress, such as: Meditation, yoga, or listening to music. Journaling. Talking to a trusted person. Spending time with friends and family. Minimize exposure to UV radiation to reduce your risk of skin cancer. Safety Always wear your seat belt while driving or riding in a vehicle. Do not drive: If you have been drinking alcohol. Do not ride with someone who has been drinking. When you are tired or distracted. While texting. If you have been using any mind-altering substances or drugs. Wear a helmet and other protective equipment during sports activities. If you have firearms in your house, make sure you follow all gun safety procedures. What's next? Visit your health care provider once a year for an annual wellness visit. Ask your health care provider how often you should have your eyes and teeth checked. Stay up to date on all vaccines. This information is not intended to replace advice given to you by your health care provider. Make sure you discuss any questions you have with your health care provider. Document Revised: 09/15/2020 Document Reviewed: 09/15/2020 Elsevier Patient Education  [place].

## 2021-04-06 NOTE — Assessment & Plan Note (Addendum)
Will continue annual thyroid US until 2026 and annual TSH.

## 2021-04-06 NOTE — Assessment & Plan Note (Signed)
Non pharmacologic treatment recommended for now. Further recommendations will be given according to 10 years CVD risk score and lipid panel numbers. 

## 2021-04-19 ENCOUNTER — Ambulatory Visit
Admission: RE | Admit: 2021-04-19 | Discharge: 2021-04-19 | Disposition: A | Discharge status: Home / Self Care | Payer: BC Managed Care – PPO | Source: Ambulatory Visit | Attending: Family Medicine | Admitting: Family Medicine

## 2021-04-19 DIAGNOSIS — E042 Nontoxic multinodular goiter: Secondary | ICD-10-CM | POA: Diagnosis not present

## 2021-04-19 DIAGNOSIS — E041 Nontoxic single thyroid nodule: Secondary | ICD-10-CM | POA: Diagnosis not present

## 2021-04-24 ENCOUNTER — Encounter: Payer: Self-pay | Admitting: Family Medicine

## 2021-04-25 DIAGNOSIS — J3089 Other allergic rhinitis: Secondary | ICD-10-CM | POA: Diagnosis not present

## 2021-04-25 DIAGNOSIS — J3081 Allergic rhinitis due to animal (cat) (dog) hair and dander: Secondary | ICD-10-CM | POA: Diagnosis not present

## 2021-05-02 DIAGNOSIS — H40023 Open angle with borderline findings, high risk, bilateral: Secondary | ICD-10-CM | POA: Diagnosis not present

## 2021-05-05 DIAGNOSIS — J3089 Other allergic rhinitis: Secondary | ICD-10-CM | POA: Diagnosis not present

## 2021-05-05 DIAGNOSIS — J3081 Allergic rhinitis due to animal (cat) (dog) hair and dander: Secondary | ICD-10-CM | POA: Diagnosis not present

## 2021-05-12 DIAGNOSIS — J3089 Other allergic rhinitis: Secondary | ICD-10-CM | POA: Diagnosis not present

## 2021-05-12 DIAGNOSIS — J3081 Allergic rhinitis due to animal (cat) (dog) hair and dander: Secondary | ICD-10-CM | POA: Diagnosis not present

## 2021-05-18 DIAGNOSIS — H6121 Impacted cerumen, right ear: Secondary | ICD-10-CM | POA: Diagnosis not present

## 2021-05-18 DIAGNOSIS — J310 Chronic rhinitis: Secondary | ICD-10-CM | POA: Diagnosis not present

## 2021-05-18 DIAGNOSIS — J342 Deviated nasal septum: Secondary | ICD-10-CM | POA: Diagnosis not present

## 2021-05-18 DIAGNOSIS — H903 Sensorineural hearing loss, bilateral: Secondary | ICD-10-CM | POA: Diagnosis not present

## 2021-05-20 DIAGNOSIS — J3089 Other allergic rhinitis: Secondary | ICD-10-CM | POA: Diagnosis not present

## 2021-05-20 DIAGNOSIS — J3081 Allergic rhinitis due to animal (cat) (dog) hair and dander: Secondary | ICD-10-CM | POA: Diagnosis not present

## 2021-05-27 DIAGNOSIS — J3089 Other allergic rhinitis: Secondary | ICD-10-CM | POA: Diagnosis not present

## 2021-05-27 DIAGNOSIS — J3081 Allergic rhinitis due to animal (cat) (dog) hair and dander: Secondary | ICD-10-CM | POA: Diagnosis not present

## 2021-05-27 DIAGNOSIS — J301 Allergic rhinitis due to pollen: Secondary | ICD-10-CM | POA: Diagnosis not present

## 2021-06-03 DIAGNOSIS — J3081 Allergic rhinitis due to animal (cat) (dog) hair and dander: Secondary | ICD-10-CM | POA: Diagnosis not present

## 2021-06-03 DIAGNOSIS — J3089 Other allergic rhinitis: Secondary | ICD-10-CM | POA: Diagnosis not present

## 2021-06-20 NOTE — Progress Notes (Signed)
? ? ?ACUTE VISIT ?Chief Complaint  ?Patient presents with  ? Abdominal Pain  ?  Ongoing for 10 days, does have IBS. Had been doing some abdominal exercises, unsure if something may have been pulled. Feels the pain more when she's leaning over. Has been eating bland foods & using heat.   ? ?HPI: ?Stephanie Henry is a 71 y.o. female with hx of thyroid nodule,HLD,and allergies here today complaining of intermittent LLQ soreness pain as described above. ?Pain has greatly improved, has not had pain since yesterday. ?It was max 6-7/10. ? ?Abdominal Pain ?This is a new problem. The current episode started 1 to 4 weeks ago. The onset quality is gradual. The problem occurs intermittently. The problem has been gradually improving. The pain is located in the LLQ. The pain is at a severity of 7/10. The pain is moderate. The abdominal pain does not radiate. Pertinent negatives include no anorexia, arthralgias, belching, diarrhea, dysuria, fever, flatus, frequency, headaches, hematochezia, hematuria, melena, myalgias, nausea, vomiting or weight loss. The pain is aggravated by certain positions and eating. The pain is relieved by Bowel movements. The treatment provided significant relief. Her past medical history is significant for irritable bowel syndrome.  ?No significant changes in bowel habits except for small bowel movement when she was having pain and "slight" amount of mucus x1 , not blood. ?Also exacerbated by bending down. ?She took Ibuprofen. ?She takes daily probiotic. ? ?She has had 3-4 days of abdominal pain in the past after eating spicy foods. ?She had pimento cheese and chili before pain started. ? ?Last colonoscopy done 06/03/13 normal and 10 years f/u was recommended. ? ?UA done on 04/06/21 positive for microscopic blood, RBC's 11-20.hpf. She has not noted gross hematuria or other usinary symptoms. First noted at her gyn's office 03/2021. ?Former smoker. ? ?Review of Systems  ?Constitutional:  Negative for  activity change, appetite change, fever and weight loss.  ?HENT:  Negative for mouth sores, sore throat and trouble swallowing.   ?Respiratory:  Negative for cough, shortness of breath and wheezing.   ?Gastrointestinal:  Positive for abdominal pain. Negative for anorexia, diarrhea, flatus, hematochezia, melena, nausea and vomiting.  ?Genitourinary:  Negative for decreased urine volume, dysuria, frequency and hematuria.  ?Musculoskeletal:  Negative for arthralgias and myalgias.  ?Neurological:  Negative for syncope and headaches.  ?Hematological:  Negative for adenopathy. Does not bruise/bleed easily.  ?Rest see pertinent positives and negatives per HPI. ? ?Current Outpatient Medications on File Prior to Visit  ?Medication Sig Dispense Refill  ? betamethasone dipropionate (DIPROLENE) 0.05 % cream Apply 1 application daily as needed topically (rash on elbow).     ? Calcium Carb-Cholecalciferol (CALCIUM + D3) 600-200 MG-UNIT TABS Take 1 tablet by mouth daily.    ? EPINEPHrine 0.3 mg/0.3 mL IJ SOAJ injection epinephrine 0.3 mg/0.3 mL injection, auto-injector    ? estradiol (ESTRACE) 0.1 MG/GM vaginal cream Place 0.5 g vaginally 2 (two) times a week.    ? Fexofenadine HCl (ALLEGRA ALLERGY PO) Allegra Allergy    ? fluticasone (FLONASE SENSIMIST) 27.5 MCG/SPRAY nasal spray Place 2 sprays into the nose daily.    ? ibuprofen (ADVIL,MOTRIN) 200 MG tablet Take 200 mg at bedtime by mouth.     ? Probiotic Product (PROBIOTIC PO) Take 1 tablet by mouth daily.    ? ?No current facility-administered medications on file prior to visit.  ? ?Past Medical History:  ?Diagnosis Date  ? Allergy   ? Arthritis   ? Cancer [facility])   ?  basal cell skin cancer  ? ?Allergies  ?Allergen Reactions  ? Cat Hair Extract Itching  ? Other Itching  ? Cephalexin Rash  ? Adhesive [Tape] Other (See Comments)  ?  Rash/ reddness  ? ? ?Social History  ? ?Socioeconomic History  ? Marital status: Married  ?  Spouse name: Not on file  ? Number of children: Not on  file  ? Years of education: Not on file  ? Highest education level: Not on file  ?Occupational History  ? Not on file  ?Tobacco Use  ? Smoking status: Former  ? Smokeless tobacco: Never  ? Tobacco comments:  ?  quit 30 + years ago  ?Vaping Use  ? Vaping Use: Former  ?Substance and Sexual Activity  ? Alcohol use: No  ? Drug use: No  ? Sexual activity: Yes  ?  Partners: Male  ?Other Topics Concern  ? Not on file  ?Social History Narrative  ? Not on file  ? ?Social Determinants of Health  ? ?Financial Resource Strain: Not on file  ?Food Insecurity: Not on file  ?Transportation Needs: Not on file  ?Physical Activity: Not on file  ?Stress: Not on file  ?Social Connections: Not on file  ? ?Vitals:  ? 06/21/21 0931  ?BP: 134/80  ?Pulse: 80  ?Resp: 16  ?SpO2: 99%  ? ?Body mass index is 26.24 kg/m?. ? ?Physical Exam ?Vitals and nursing note reviewed.  ?Constitutional:   ?   General: She is not in acute distress. ?   Appearance: She is well-developed. She is not ill-appearing.  ?HENT:  ?   Head: Atraumatic.  ?Eyes:  ?   General: No scleral icterus. ?   Conjunctiva/sclera: Conjunctivae normal.  ?Cardiovascular:  ?   Rate and Rhythm: Normal rate and regular rhythm.  ?   Heart sounds: No murmur heard. ?Pulmonary:  ?   Effort: Pulmonary effort is normal. No respiratory distress.  ?   Breath sounds: Normal breath sounds.  ?Abdominal:  ?   General: Bowel sounds are normal. There is no distension.  ?   Palpations: Abdomen is soft. There is no hepatomegaly or mass.  ?   Tenderness: There is no abdominal tenderness. There is no right CVA tenderness, left CVA tenderness or rebound. Negative signs include Rovsing's sign.  ?Lymphadenopathy:  ?   Cervical: No cervical adenopathy.  ?   Upper Body:  ?   Right upper body: No supraclavicular adenopathy.  ?   Left upper body: No supraclavicular adenopathy.  ?Skin: ?   General: Skin is warm.  ?   Findings: No erythema or rash.  ?Neurological:  ?   Mental Status: She is alert and oriented to  person, place, and time.  ?Psychiatric:  ?   Comments: Well groomed, good eye contact.  ? ?ASSESSMENT AND PLAN: ? ?Ms.Prerana was seen today for abdominal pain. ? ?Diagnoses and all orders for this visit: ?Orders Placed This Encounter  ?Procedures  ? Culture, Urine  ? Varicella-zoster vaccine IM  ? Urinalysis, Routine w reflex microscopic  ? ?LLQ abdominal pain ?We discussed possible etiologies. ?Pain has resolved, hx and examination do not suggest a serious process. We can hold on blood work or imaging for now. ?Instructed about warning signs. ? ?Asymptomatic microscopic hematuria ?UA and Ucx done today. ?If UA with RBC's, urology evaluation will be considered. ? ?Need for shingles vaccine ?-     Varicella-zoster vaccine IM ? ?Return if symptoms worsen or fail to improve. ? ?Malka So.  [place], MD ? ?[place]. ?[place] office. ? ?

## 2021-06-21 ENCOUNTER — Ambulatory Visit (INDEPENDENT_AMBULATORY_CARE_PROVIDER_SITE_OTHER): Payer: BC Managed Care – PPO | Admitting: Family Medicine

## 2021-06-21 ENCOUNTER — Encounter: Payer: Self-pay | Admitting: Family Medicine

## 2021-06-21 VITALS — BP 134/80 | HR 80 | Resp 16 | Ht 64.5 in | Wt 155.2 lb

## 2021-06-21 DIAGNOSIS — R1032 Left lower quadrant pain: Secondary | ICD-10-CM | POA: Diagnosis not present

## 2021-06-21 DIAGNOSIS — Z23 Encounter for immunization: Secondary | ICD-10-CM | POA: Diagnosis not present

## 2021-06-21 DIAGNOSIS — R3121 Asymptomatic microscopic hematuria: Secondary | ICD-10-CM

## 2021-06-21 LAB — URINALYSIS, ROUTINE W REFLEX MICROSCOPIC
Bilirubin Urine: NEGATIVE
Ketones, ur: NEGATIVE
Leukocytes,Ua: NEGATIVE
Nitrite: NEGATIVE
Specific Gravity, Urine: 1.01 (ref 1.000–1.030)
Total Protein, Urine: NEGATIVE
Urine Glucose: NEGATIVE
Urobilinogen, UA: 0.2 (ref 0.0–1.0)
pH: 6 (ref 5.0–8.0)

## 2021-06-21 NOTE — Patient Instructions (Signed)
A few things to remember from today's visit: ? ?Asymptomatic microscopic hematuria - Plan: Urinalysis, Routine w reflex microscopic, Culture, Urine ? ?LLQ abdominal pain ? ?If you need refills please call your pharmacy. ?Do not use My Chart to request refills or for acute issues that need immediate attention. ?  ?Monitor for new symptoms. ?Good fiber and fluid intake. ?If pain reoccurs, we can go a head with blood work and maybe imaging. ? ?Please be sure medication list is accurate. ?If a new problem present, please set up appointment sooner than planned today. ? ? ? ? ? ? ? ?

## 2021-06-22 LAB — URINE CULTURE
MICRO NUMBER:: 13158639
Result:: NO GROWTH
SPECIMEN QUALITY:: ADEQUATE

## 2021-06-23 DIAGNOSIS — M79671 Pain in right foot: Secondary | ICD-10-CM | POA: Diagnosis not present

## 2021-07-07 DIAGNOSIS — M25571 Pain in right ankle and joints of right foot: Secondary | ICD-10-CM | POA: Diagnosis not present

## 2021-07-07 DIAGNOSIS — S93491A Sprain of other ligament of right ankle, initial encounter: Secondary | ICD-10-CM | POA: Diagnosis not present

## 2021-07-13 DIAGNOSIS — J3081 Allergic rhinitis due to animal (cat) (dog) hair and dander: Secondary | ICD-10-CM | POA: Diagnosis not present

## 2021-07-13 DIAGNOSIS — J301 Allergic rhinitis due to pollen: Secondary | ICD-10-CM | POA: Diagnosis not present

## 2021-07-13 DIAGNOSIS — J3089 Other allergic rhinitis: Secondary | ICD-10-CM | POA: Diagnosis not present

## 2021-07-18 ENCOUNTER — Encounter: Payer: Self-pay | Admitting: Family Medicine

## 2021-07-18 DIAGNOSIS — J301 Allergic rhinitis due to pollen: Secondary | ICD-10-CM | POA: Diagnosis not present

## 2021-07-18 DIAGNOSIS — J3089 Other allergic rhinitis: Secondary | ICD-10-CM | POA: Diagnosis not present

## 2021-07-18 DIAGNOSIS — J3081 Allergic rhinitis due to animal (cat) (dog) hair and dander: Secondary | ICD-10-CM | POA: Diagnosis not present

## 2021-07-27 DIAGNOSIS — J301 Allergic rhinitis due to pollen: Secondary | ICD-10-CM | POA: Diagnosis not present

## 2021-07-27 DIAGNOSIS — J3089 Other allergic rhinitis: Secondary | ICD-10-CM | POA: Diagnosis not present

## 2021-07-27 DIAGNOSIS — J3081 Allergic rhinitis due to animal (cat) (dog) hair and dander: Secondary | ICD-10-CM | POA: Diagnosis not present

## 2021-07-28 DIAGNOSIS — M25571 Pain in right ankle and joints of right foot: Secondary | ICD-10-CM | POA: Diagnosis not present

## 2021-07-28 DIAGNOSIS — S93491A Sprain of other ligament of right ankle, initial encounter: Secondary | ICD-10-CM | POA: Diagnosis not present

## 2021-08-05 DIAGNOSIS — J301 Allergic rhinitis due to pollen: Secondary | ICD-10-CM | POA: Diagnosis not present

## 2021-08-05 DIAGNOSIS — J3089 Other allergic rhinitis: Secondary | ICD-10-CM | POA: Diagnosis not present

## 2021-08-05 DIAGNOSIS — J3081 Allergic rhinitis due to animal (cat) (dog) hair and dander: Secondary | ICD-10-CM | POA: Diagnosis not present

## 2021-08-25 DIAGNOSIS — H903 Sensorineural hearing loss, bilateral: Secondary | ICD-10-CM | POA: Diagnosis not present

## 2021-08-25 DIAGNOSIS — J301 Allergic rhinitis due to pollen: Secondary | ICD-10-CM | POA: Diagnosis not present

## 2021-08-25 DIAGNOSIS — J3081 Allergic rhinitis due to animal (cat) (dog) hair and dander: Secondary | ICD-10-CM | POA: Diagnosis not present

## 2021-08-25 DIAGNOSIS — J3089 Other allergic rhinitis: Secondary | ICD-10-CM | POA: Diagnosis not present

## 2021-08-31 DIAGNOSIS — J3081 Allergic rhinitis due to animal (cat) (dog) hair and dander: Secondary | ICD-10-CM | POA: Diagnosis not present

## 2021-08-31 DIAGNOSIS — J3089 Other allergic rhinitis: Secondary | ICD-10-CM | POA: Diagnosis not present

## 2021-09-08 DIAGNOSIS — R438 Other disturbances of smell and taste: Secondary | ICD-10-CM | POA: Diagnosis not present

## 2021-09-08 DIAGNOSIS — J342 Deviated nasal septum: Secondary | ICD-10-CM | POA: Diagnosis not present

## 2021-09-08 DIAGNOSIS — J3081 Allergic rhinitis due to animal (cat) (dog) hair and dander: Secondary | ICD-10-CM | POA: Diagnosis not present

## 2021-09-08 DIAGNOSIS — J3089 Other allergic rhinitis: Secondary | ICD-10-CM | POA: Diagnosis not present

## 2021-09-14 DIAGNOSIS — D2262 Melanocytic nevi of left upper limb, including shoulder: Secondary | ICD-10-CM | POA: Diagnosis not present

## 2021-09-14 DIAGNOSIS — L718 Other rosacea: Secondary | ICD-10-CM | POA: Diagnosis not present

## 2021-09-14 DIAGNOSIS — D225 Melanocytic nevi of trunk: Secondary | ICD-10-CM | POA: Diagnosis not present

## 2021-09-14 DIAGNOSIS — L570 Actinic keratosis: Secondary | ICD-10-CM | POA: Diagnosis not present

## 2021-09-14 DIAGNOSIS — Z85828 Personal history of other malignant neoplasm of skin: Secondary | ICD-10-CM | POA: Diagnosis not present

## 2021-09-20 DIAGNOSIS — J301 Allergic rhinitis due to pollen: Secondary | ICD-10-CM | POA: Diagnosis not present

## 2021-09-20 DIAGNOSIS — J3081 Allergic rhinitis due to animal (cat) (dog) hair and dander: Secondary | ICD-10-CM | POA: Diagnosis not present

## 2021-09-20 DIAGNOSIS — J3089 Other allergic rhinitis: Secondary | ICD-10-CM | POA: Diagnosis not present

## 2021-09-29 DIAGNOSIS — J3089 Other allergic rhinitis: Secondary | ICD-10-CM | POA: Diagnosis not present

## 2021-09-29 DIAGNOSIS — J301 Allergic rhinitis due to pollen: Secondary | ICD-10-CM | POA: Diagnosis not present

## 2021-09-29 DIAGNOSIS — J3081 Allergic rhinitis due to animal (cat) (dog) hair and dander: Secondary | ICD-10-CM | POA: Diagnosis not present

## 2021-10-06 DIAGNOSIS — J3081 Allergic rhinitis due to animal (cat) (dog) hair and dander: Secondary | ICD-10-CM | POA: Diagnosis not present

## 2021-10-06 DIAGNOSIS — J301 Allergic rhinitis due to pollen: Secondary | ICD-10-CM | POA: Diagnosis not present

## 2021-10-06 DIAGNOSIS — J3089 Other allergic rhinitis: Secondary | ICD-10-CM | POA: Diagnosis not present

## 2021-10-12 DIAGNOSIS — L720 Epidermal cyst: Secondary | ICD-10-CM | POA: Diagnosis not present

## 2021-10-14 DIAGNOSIS — J301 Allergic rhinitis due to pollen: Secondary | ICD-10-CM | POA: Diagnosis not present

## 2021-10-14 DIAGNOSIS — J3089 Other allergic rhinitis: Secondary | ICD-10-CM | POA: Diagnosis not present

## 2021-10-14 DIAGNOSIS — J3081 Allergic rhinitis due to animal (cat) (dog) hair and dander: Secondary | ICD-10-CM | POA: Diagnosis not present

## 2021-11-04 DIAGNOSIS — J3081 Allergic rhinitis due to animal (cat) (dog) hair and dander: Secondary | ICD-10-CM | POA: Diagnosis not present

## 2021-11-04 DIAGNOSIS — J3089 Other allergic rhinitis: Secondary | ICD-10-CM | POA: Diagnosis not present

## 2021-11-11 DIAGNOSIS — J3081 Allergic rhinitis due to animal (cat) (dog) hair and dander: Secondary | ICD-10-CM | POA: Diagnosis not present

## 2021-11-11 DIAGNOSIS — J3089 Other allergic rhinitis: Secondary | ICD-10-CM | POA: Diagnosis not present

## 2021-11-25 DIAGNOSIS — J3081 Allergic rhinitis due to animal (cat) (dog) hair and dander: Secondary | ICD-10-CM | POA: Diagnosis not present

## 2021-11-25 DIAGNOSIS — J301 Allergic rhinitis due to pollen: Secondary | ICD-10-CM | POA: Diagnosis not present

## 2021-11-25 DIAGNOSIS — J3089 Other allergic rhinitis: Secondary | ICD-10-CM | POA: Diagnosis not present

## 2021-11-28 DIAGNOSIS — M25522 Pain in left elbow: Secondary | ICD-10-CM | POA: Diagnosis not present

## 2021-11-28 DIAGNOSIS — M24022 Loose body in left elbow: Secondary | ICD-10-CM | POA: Diagnosis not present

## 2021-12-06 DIAGNOSIS — J3081 Allergic rhinitis due to animal (cat) (dog) hair and dander: Secondary | ICD-10-CM | POA: Diagnosis not present

## 2021-12-06 DIAGNOSIS — J3089 Other allergic rhinitis: Secondary | ICD-10-CM | POA: Diagnosis not present

## 2021-12-06 DIAGNOSIS — J301 Allergic rhinitis due to pollen: Secondary | ICD-10-CM | POA: Diagnosis not present

## 2021-12-11 IMAGING — US US THYROID
1 series · 13 of 25 positions shown · non-contrast
Comparison: 10/04/2018

CLINICAL DATA: Thyroid nodule follow-up.

EXAM:
THYROID ULTRASOUND
TECHNIQUE: Ultrasound examination of the thyroid gland and adjacent soft
tissues was performed.

[Series 1: us thyroid · 0.05mm/px · 13 of 62 slices shown]
[im 1/62]
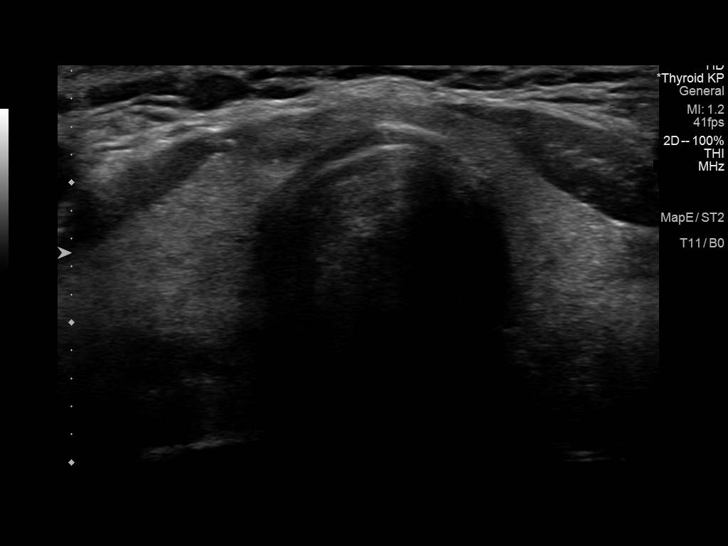
[im 6/62]
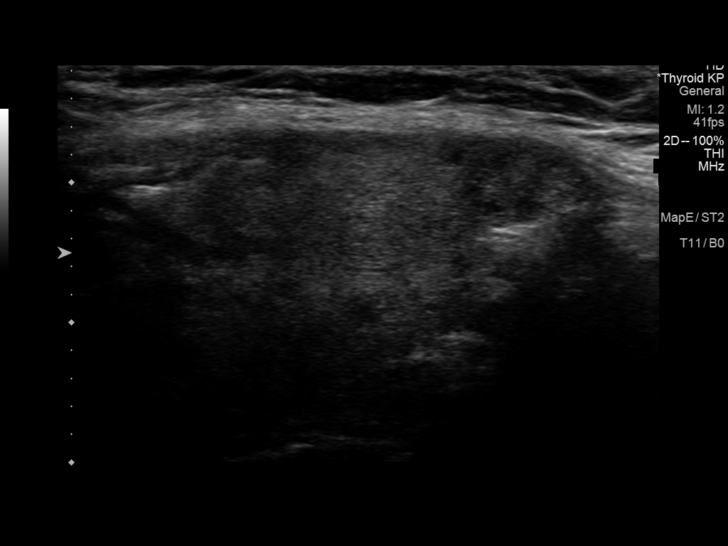
[im 11/62]
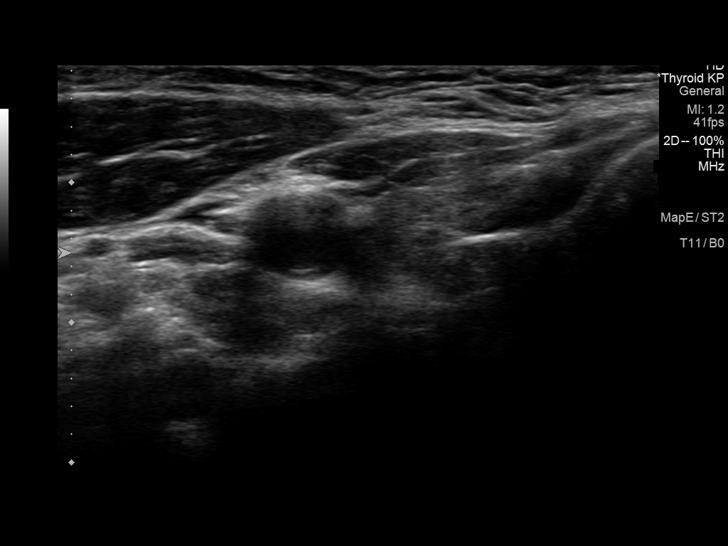
[im 16/62]
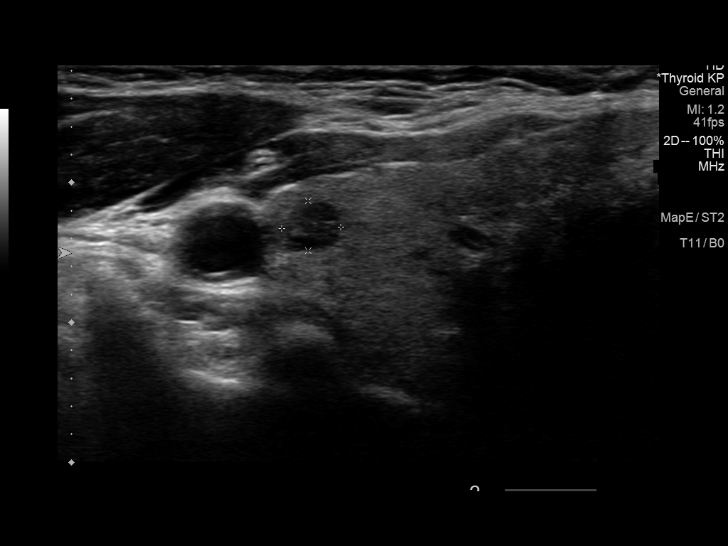
[im 21/62]
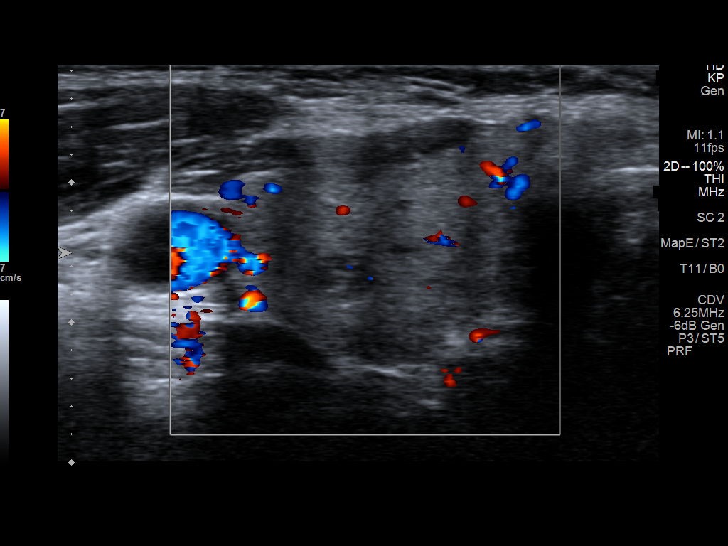
[im 26/62]
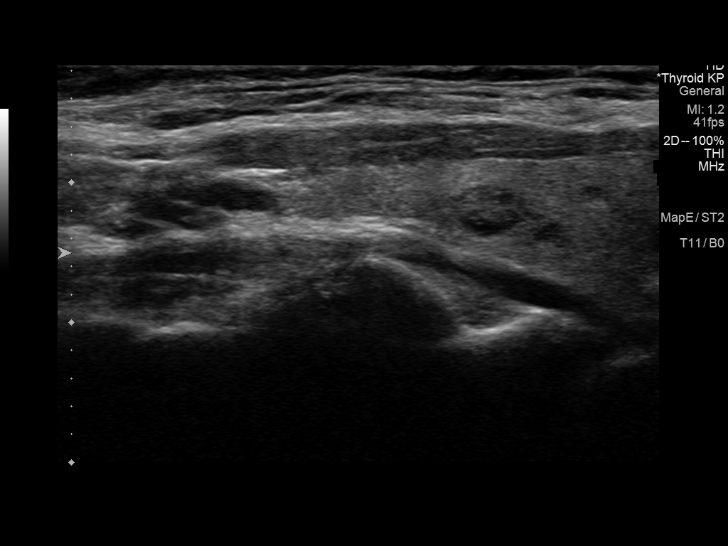
[im 31/62]
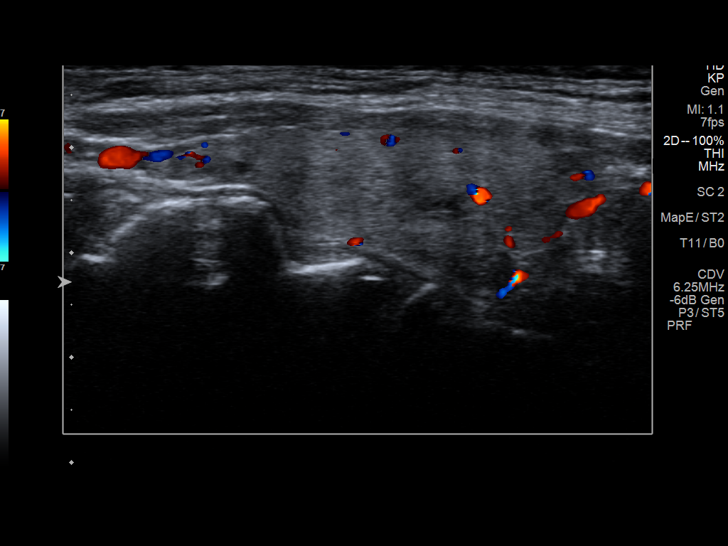
[im 36/62]
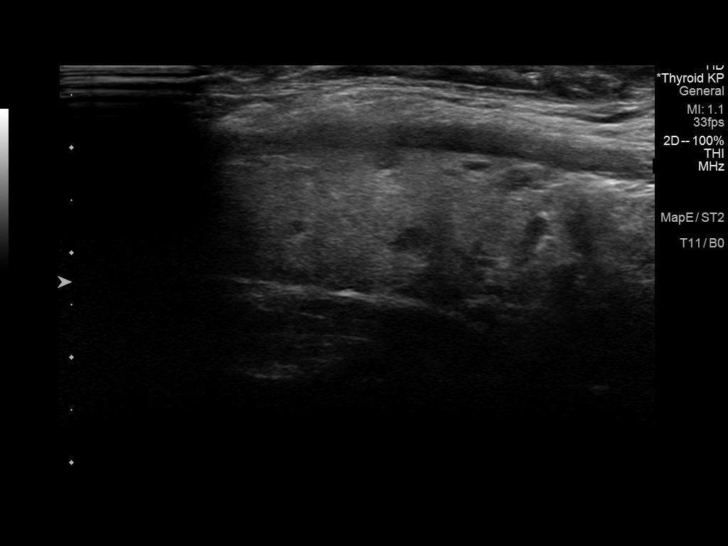
[im 41/62]
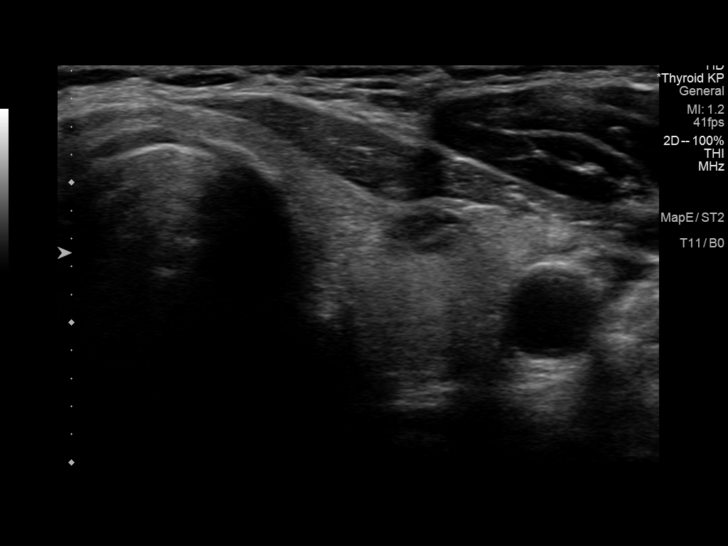
[im 46/62]
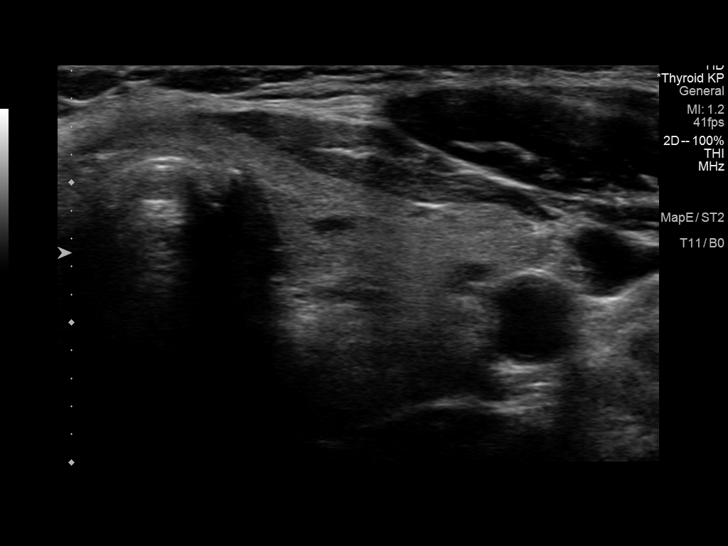
[im 51/62]
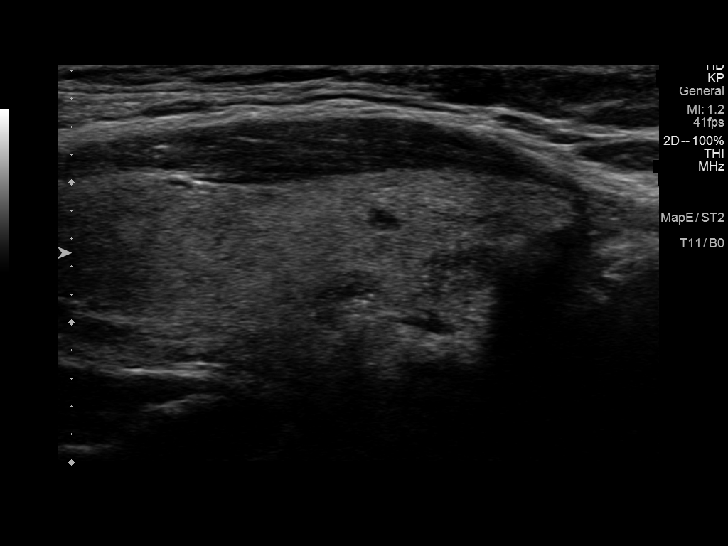
[im 56/62]
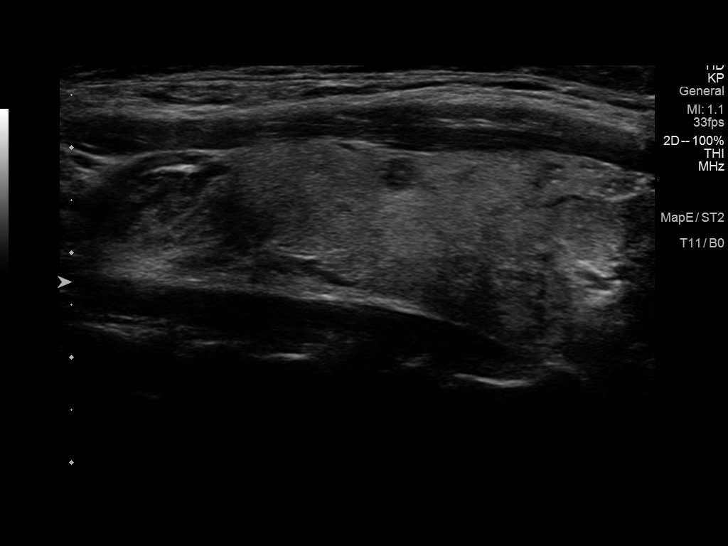
[im 62/62]
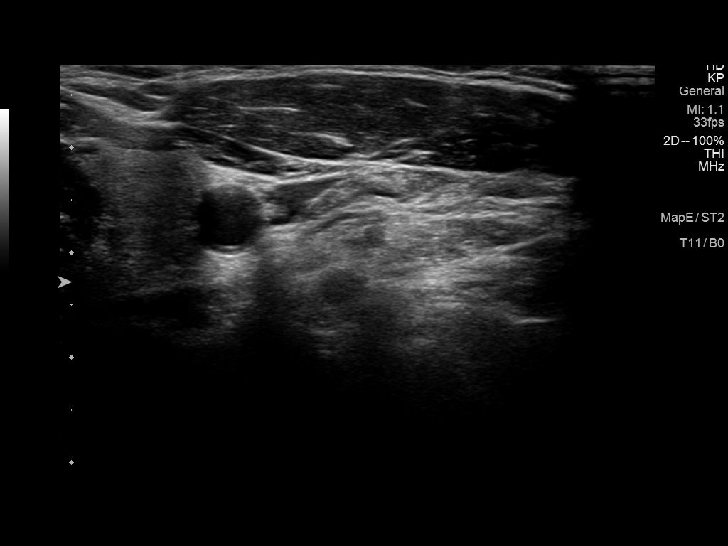

[13 of 25 positions shown; findings below may reference images not displayed]

FINDINGS: Parenchymal Echotexture: Mildly heterogenous

Isthmus: 0.3 cm

Right lobe: 4.7 x 1.5 x 2.1 cm

Left lobe: 4.4 x 1.7 x 1.8 cm

_________________________________________________________

Estimated total number of nodules >/= 1 cm: 1

Number of spongiform nodules >/=  2 cm not described below (TR1): 0

Number of mixed cystic and solid nodules >/= 1.5 cm not described
below (TR2): 0

_________________________________________________________

The previously demonstrated thyroid nodule in the right superior
thyroid gland has demonstrated interval decrease in size and now
measure 0.6 x 0.4 x 0.4 cm (previously measuring 1 x 0.7 x 0.5 cm.

Nodule # 1:

Prior biopsy: No

Location: Isthmus; Inferior

Maximum size: 1 cm; Other 2 dimensions: 0.8 x 0.7 cm, previously, 1
x 0.8 x 0.7 cm

Composition: solid/almost completely solid (2)

Echogenicity: hypoechoic (2)

Shape: not taller-than-wide (0)

Margins: ill-defined (0)

Echogenic foci: none (0)

ACR TI-RADS total points: 5.

ACR TI-RADS risk category:  TR4 (4-6 points).

Significant change in size (>/= 20% in two dimensions and minimal
increase of 2 mm): No

Change in features: No

Change in ACR TI-RADS risk category: No

ACR TI-RADS recommendations:

*Given size (>/= 1 - 1.4 cm) and appearance, a follow-up ultrasound
in 1 year should be considered based on TI-RADS criteria.

_________________________________________________________
IMPRESSION: Stable 1 cm TR 4 thyroid nodule in the isthmus. Yearly ultrasound
follow-up is recommended to document 5 years of stability.

The above is in keeping with the ACR TI-RADS recommendations - [HOSPITAL] 5196;[DATE].

## 2021-12-20 DIAGNOSIS — J3081 Allergic rhinitis due to animal (cat) (dog) hair and dander: Secondary | ICD-10-CM | POA: Diagnosis not present

## 2021-12-20 DIAGNOSIS — J3089 Other allergic rhinitis: Secondary | ICD-10-CM | POA: Diagnosis not present

## 2021-12-29 DIAGNOSIS — J301 Allergic rhinitis due to pollen: Secondary | ICD-10-CM | POA: Diagnosis not present

## 2021-12-29 DIAGNOSIS — J3081 Allergic rhinitis due to animal (cat) (dog) hair and dander: Secondary | ICD-10-CM | POA: Diagnosis not present

## 2021-12-29 DIAGNOSIS — J3089 Other allergic rhinitis: Secondary | ICD-10-CM | POA: Diagnosis not present

## 2022-01-11 DIAGNOSIS — J301 Allergic rhinitis due to pollen: Secondary | ICD-10-CM | POA: Diagnosis not present

## 2022-01-11 DIAGNOSIS — J3089 Other allergic rhinitis: Secondary | ICD-10-CM | POA: Diagnosis not present

## 2022-01-20 DIAGNOSIS — J301 Allergic rhinitis due to pollen: Secondary | ICD-10-CM | POA: Diagnosis not present

## 2022-01-20 DIAGNOSIS — J3089 Other allergic rhinitis: Secondary | ICD-10-CM | POA: Diagnosis not present

## 2022-01-20 DIAGNOSIS — J3081 Allergic rhinitis due to animal (cat) (dog) hair and dander: Secondary | ICD-10-CM | POA: Diagnosis not present

## 2022-01-27 DIAGNOSIS — J3081 Allergic rhinitis due to animal (cat) (dog) hair and dander: Secondary | ICD-10-CM | POA: Diagnosis not present

## 2022-01-27 DIAGNOSIS — J301 Allergic rhinitis due to pollen: Secondary | ICD-10-CM | POA: Diagnosis not present

## 2022-01-27 DIAGNOSIS — J3089 Other allergic rhinitis: Secondary | ICD-10-CM | POA: Diagnosis not present

## 2022-02-03 DIAGNOSIS — J3081 Allergic rhinitis due to animal (cat) (dog) hair and dander: Secondary | ICD-10-CM | POA: Diagnosis not present

## 2022-02-03 DIAGNOSIS — J301 Allergic rhinitis due to pollen: Secondary | ICD-10-CM | POA: Diagnosis not present

## 2022-02-03 DIAGNOSIS — J3089 Other allergic rhinitis: Secondary | ICD-10-CM | POA: Diagnosis not present

## 2022-02-20 DIAGNOSIS — J3089 Other allergic rhinitis: Secondary | ICD-10-CM | POA: Diagnosis not present

## 2022-02-20 DIAGNOSIS — J3081 Allergic rhinitis due to animal (cat) (dog) hair and dander: Secondary | ICD-10-CM | POA: Diagnosis not present

## 2022-02-20 DIAGNOSIS — J301 Allergic rhinitis due to pollen: Secondary | ICD-10-CM | POA: Diagnosis not present

## 2022-03-15 DIAGNOSIS — J301 Allergic rhinitis due to pollen: Secondary | ICD-10-CM | POA: Diagnosis not present

## 2022-03-15 DIAGNOSIS — J3089 Other allergic rhinitis: Secondary | ICD-10-CM | POA: Diagnosis not present

## 2022-03-15 DIAGNOSIS — J3081 Allergic rhinitis due to animal (cat) (dog) hair and dander: Secondary | ICD-10-CM | POA: Diagnosis not present

## 2022-03-21 DIAGNOSIS — Z1231 Encounter for screening mammogram for malignant neoplasm of breast: Secondary | ICD-10-CM | POA: Diagnosis not present

## 2022-03-21 DIAGNOSIS — Z13820 Encounter for screening for osteoporosis: Secondary | ICD-10-CM | POA: Diagnosis not present

## 2022-03-21 DIAGNOSIS — Z01419 Encounter for gynecological examination (general) (routine) without abnormal findings: Secondary | ICD-10-CM | POA: Diagnosis not present

## 2022-03-21 DIAGNOSIS — Z6826 Body mass index (BMI) 26.0-26.9, adult: Secondary | ICD-10-CM | POA: Diagnosis not present

## 2022-03-31 DIAGNOSIS — J3081 Allergic rhinitis due to animal (cat) (dog) hair and dander: Secondary | ICD-10-CM | POA: Diagnosis not present

## 2022-03-31 DIAGNOSIS — J3089 Other allergic rhinitis: Secondary | ICD-10-CM | POA: Diagnosis not present

## 2022-04-12 NOTE — Progress Notes (Signed)
HPI: Ms.Rae W Harjo is a 72 y.o. female with past medical history significant for hyperlipidemia, allergy rhinitis, and thyroid nodule here today for her routine physical.  Last CPE: 04/06/21 She has been exercising regularly, using a treadmill three to four times per week and attending a yoga class once per week.  She cooks at home most of the time and consumes vegetables daily.  Eats out or take out 1-2 times per week, it is not every week. She sleeps an average of seven hours per night.  Immunization History  Administered Date(s) Administered   Fluad Quad(high Dose 65+) 03/23/2020   Influenza, High Dose Seasonal PF 01/17/2016, 12/08/2016, 04/27/2018, 01/07/2019, 04/29/2019, 06/08/2020   Influenza-Unspecified 02/21/2017, 01/18/2019, 01/01/2021   PFIZER(Purple Top)SARS-COV-2 Vaccination 04/14/2019, 05/05/2019, 11/19/2019, 06/08/2020   Pfizer Covid-19 Vaccine Bivalent Booster 32yr & up 01/31/2021   Pneumococcal Conjugate-13 12/08/2016   Pneumococcal Polysaccharide-23 10/18/2018, 04/29/2019, 06/08/2020   Zoster Recombinat (Shingrix) 04/06/2021, 06/21/2021   Health Maintenance  Topic Date Due   DTaP/Tdap/Td (1 - Tdap) Never done   INFLUENZA VACCINE  11/01/2021   COVID-19 Vaccine (6 - 2023-24 season) 04/30/2022 (Originally 12/02/2021)   MAMMOGRAM  03/04/2023   COLONOSCOPY (Pts 45-455yrInsurance coverage will need to be confirmed)  06/04/2023   Pneumonia Vaccine 6571Years old  Completed   DEXA SCAN  Completed   Hepatitis C Screening  Completed   Zoster Vaccines- Shingrix  Completed   HPV VACCINES  Aged Out   She sees gynecologist for atrophic vaginitis and female preventative care.  Reporting bone density done in 03/2022 along with her mammogram at her gynecologist's office.  Allergy rhinitis, she follows with immunology regularly and on immunotherapy. She has also seen orthopedics for joint pain.  Hyperlipidemia: Currently she is on nonpharmacologic treatment. Lab Results   Component Value Date   CHOL 202 (H) 04/06/2021   HDL 45.00 04/06/2021   LDLCALC 137 (H) 04/06/2021   TRIG 101.0 04/06/2021   CHOLHDL 4 04/06/2021   Slightly elevated glucose in 04/2021, fasting lab. No history of diabetes. Thyroid nodule/multinodular goiter: Lab Results  Component Value Date   TSH 1.38 04/06/2021  Thyroid US[place]oreaone on 04/19/2021, otherwise stable, it was recommended to follow every 1 to 2 years with thyroid US[place]antil completed 5 years of stability from 2020.  GERD: She reports experiencing heartburn and an "indigestion" like symptoms. She has tried over-the-counter Tums for the heartburn.  She mentions that she no longer takes ibuprofen regularly, only occasionally.  She sees eye care provider regularly.  TMJ syndrome: Problem is on the right side, with soreness and clicking when opening her mouth wide or chewing.  She follows with her dentist, currently she is wearing a guard at night, which has helped.  Review of Systems  Constitutional:  Negative for activity change, appetite change and fever.  HENT:  Negative for hearing loss, mouth sores, sore throat and trouble swallowing.   Eyes:  Negative for redness and visual disturbance.  Respiratory:  Negative for cough, shortness of breath and wheezing.   Cardiovascular:  Negative for chest pain and leg swelling.  Gastrointestinal:  Negative for abdominal pain, nausea and vomiting.       No changes in bowel habits.  Endocrine: Negative for cold intolerance, heat intolerance, polydipsia, polyphagia and polyuria.  Genitourinary:  Negative for decreased urine volume, dysuria, hematuria, vaginal bleeding and vaginal discharge.  Musculoskeletal:  Positive for arthralgias. Negative for gait problem and myalgias.  Skin:  Negative for color change and rash.  Allergic/Immunologic: Positive for environmental allergies.  Neurological:  Negative for syncope, weakness and headaches.  Hematological:  Negative for adenopathy. Does not  bruise/bleed easily.  Psychiatric/Behavioral:  Negative for behavioral problems and confusion.   All other systems reviewed and are negative.  Current Outpatient Medications on File Prior to Visit  Medication Sig Dispense Refill   betamethasone dipropionate (DIPROLENE) 0.05 % cream Apply 1 application daily as needed topically (rash on elbow).      Calcium Carb-Cholecalciferol (CALCIUM + D3) 600-200 MG-UNIT TABS Take 1 tablet by mouth daily.     EPINEPHrine 0.3 mg/0.3 mL IJ SOAJ injection epinephrine 0.3 mg/0.3 mL injection, auto-injector     estradiol (ESTRACE) 0.1 MG/GM vaginal cream Place 0.5 g vaginally 2 (two) times a week.     Fexofenadine HCl (ALLEGRA ALLERGY PO) Allegra Allergy     fluticasone (FLONASE SENSIMIST) 27.5 MCG/SPRAY nasal spray Place 2 sprays into the nose daily.     ibuprofen (ADVIL,MOTRIN) 200 MG tablet Take 200 mg at bedtime by mouth.      Probiotic Product (PROBIOTIC PO) Take 1 tablet by mouth daily.     No current facility-administered medications on file prior to visit.   Past Medical History:  Diagnosis Date   Allergy    Arthritis    Cancer ([place])    basal cell skin cancer   Past Surgical History:  Procedure Laterality Date   CESAREAN SECTION     x3   CHOLECYSTECTOMY     LIPOMA EXCISION N/A 02/14/2017   Procedure: EXCISION POSTERIOR NECK CYST;  Surgeon: Coralie Keens, MD;  Location: [facility];  Service: General;  Laterality: N/A;   NASAL RECONSTRUCTION     PARTIAL HYSTERECTOMY     TONSILLECTOMY     Allergies  Allergen Reactions   Cat Hair Extract Itching   Other Itching   Cephalexin Rash   Adhesive [Tape] Other (See Comments)    Rash/ reddness   Family History  Problem Relation Age of Onset   Hypertension Mother    Heart disease Father        CAD   Hypertension Father    Colon cancer Neg Hx    Social History   Socioeconomic History   Marital status: Married    Spouse name: Not on file   Number of children: Not on file   Years of  education: Not on file   Highest education level: Not on file  Occupational History   Not on file  Tobacco Use   Smoking status: Former   Smokeless tobacco: Never   Tobacco comments:    quit 30 + years ago  Vaping Use   Vaping Use: Former  Substance and Sexual Activity   Alcohol use: No   Drug use: No   Sexual activity: Yes    Partners: Male  Other Topics Concern   Not on file  Social History Narrative   Not on file   Social Determinants of Health   Financial Resource Strain: Not on file  Food Insecurity: Not on file  Transportation Needs: Not on file  Physical Activity: Not on file  Stress: Not on file  Social Connections: Not on file   Vitals:   04/14/22 1117  BP: 126/80  Pulse: 65  Resp: 16  Temp: 98.1 F (36.7 C)  SpO2: 99%  Body mass index is 26.19 kg/m.  Wt Readings from Last 3 Encounters:  04/14/22 155 lb (70.3 kg)  06/21/21 155 lb 4 oz (70.4 kg)  04/06/21 155 lb  2 oz (70.4 kg)  Physical Exam Vitals and nursing note reviewed.  Constitutional:      General: She is not in acute distress.    Appearance: She is well-developed.  HENT:     Head: Normocephalic and atraumatic.     Right Ear: Hearing, tympanic membrane, ear canal and external ear normal.     Left Ear: Hearing, tympanic membrane, ear canal and external ear normal.     Mouth/Throat:     Mouth: Mucous membranes are moist.     Pharynx: Oropharynx is clear. Uvula midline.  Eyes:     Extraocular Movements: Extraocular movements intact.     Conjunctiva/sclera: Conjunctivae normal.     Pupils: Pupils are equal, round, and reactive to light.  Neck:     Thyroid: Thyromegaly present.  Cardiovascular:     Rate and Rhythm: Normal rate and regular rhythm.     Pulses:          Dorsalis pedis pulses are 2+ on the right side and 2+ on the left side.     Heart sounds: No murmur heard.    Comments: Varicose veins LE, bilateral. Pulmonary:     Effort: Pulmonary effort is normal. No respiratory  distress.     Breath sounds: Normal breath sounds.  Abdominal:     Palpations: Abdomen is soft. There is no hepatomegaly or mass.     Tenderness: There is no abdominal tenderness.  Genitourinary:    Comments: Deferred to gyn. Musculoskeletal:     Right lower leg: No edema.     Left lower leg: No edema.     Comments: No signs of synovitis appreciated.  Lymphadenopathy:     Cervical: No cervical adenopathy.  Skin:    General: Skin is warm.     Findings: No erythema or rash.  Neurological:     General: No focal deficit present.     Mental Status: She is alert and oriented to person, place, and time.     Cranial Nerves: No cranial nerve deficit.     Coordination: Coordination normal.     Gait: Gait normal.     Deep Tendon Reflexes:     Reflex Scores:      Bicep reflexes are 2+ on the right side and 2+ on the left side.      Patellar reflexes are 2+ on the right side and 2+ on the left side. Psychiatric:        Mood and Affect: Mood and affect normal.   ASSESSMENT AND PLAN: Ms. Saga Balthazar Zadrozny was here today annual physical examination.  Orders Placed This Encounter  Procedures   US THYROID   Comprehensive metabolic panel   Lipid panel   TSH   Hemoglobin A1c   Routine general medical examination at a health care facility Assessment & Plan: We discussed the importance of regular physical activity and healthy diet for prevention of chronic illness and/or complications. Preventive guidelines reviewed. Continue her female preventive care with gynecologist. Vaccination is up-to-date.  Holding on flu vaccine because she received allergy shot today, she was instructed to wait on vaccination for 3 days. Ca++ and vit D supplementation to continue. Next CPE in a year.   Hyperlipidemia, unspecified hyperlipidemia type Assessment & Plan: Non pharmacologic treatment recommended for now. Further recommendations will be given according to 10 years CVD risk score and lipid panel  numbers.  Orders: -     Comprehensive metabolic panel; Future -     Lipid panel; Future  Gastroesophageal reflux disease, unspecified whether esophagitis present Assessment & Plan: Recommend omeprazole 40 mg 30-minute before breakfast for 6 to 8 weeks. Recommend avoiding foods that could aggravate problem, GERD precautions. Avoid NSAID's. Follow-up as needed.  Orders: -     Omeprazole; Take 1 capsule (40 mg total) by mouth daily.  Dispense: 30 capsule; Refill: 1  Elevated glucose -     Hemoglobin A1c; Future  Multiple thyroid nodules/multinodular goiter Assessment & Plan: We discussed findings on last thyroid US, recommendation was to follow-up every 1 to 2 years with imaging, she would like to proceed now with thyroid US. We will continue following TSH annually.  Orders: -     TSH; Future -     US THYROID; Future  Return in 1 year (on 04/15/2023) for CPE.  Davey Bergsma G. [place], MD  [facility]. [place] office.

## 2022-04-14 ENCOUNTER — Encounter: Payer: Self-pay | Admitting: Family Medicine

## 2022-04-14 ENCOUNTER — Ambulatory Visit (INDEPENDENT_AMBULATORY_CARE_PROVIDER_SITE_OTHER): Payer: BC Managed Care – PPO | Admitting: Family Medicine

## 2022-04-14 VITALS — BP 126/80 | HR 65 | Temp 98.1°F | Resp 16 | Ht 64.5 in | Wt 155.0 lb

## 2022-04-14 DIAGNOSIS — E785 Hyperlipidemia, unspecified: Secondary | ICD-10-CM

## 2022-04-14 DIAGNOSIS — J3081 Allergic rhinitis due to animal (cat) (dog) hair and dander: Secondary | ICD-10-CM | POA: Diagnosis not present

## 2022-04-14 DIAGNOSIS — J3089 Other allergic rhinitis: Secondary | ICD-10-CM | POA: Diagnosis not present

## 2022-04-14 DIAGNOSIS — J301 Allergic rhinitis due to pollen: Secondary | ICD-10-CM | POA: Diagnosis not present

## 2022-04-14 DIAGNOSIS — Z0000 Encounter for general adult medical examination without abnormal findings: Secondary | ICD-10-CM | POA: Diagnosis not present

## 2022-04-14 DIAGNOSIS — K219 Gastro-esophageal reflux disease without esophagitis: Secondary | ICD-10-CM

## 2022-04-14 DIAGNOSIS — E042 Nontoxic multinodular goiter: Secondary | ICD-10-CM | POA: Diagnosis not present

## 2022-04-14 DIAGNOSIS — R7309 Other abnormal glucose: Secondary | ICD-10-CM

## 2022-04-14 LAB — COMPREHENSIVE METABOLIC PANEL
ALT: 18 U/L (ref 0–35)
AST: 18 U/L (ref 0–37)
Albumin: 4.3 g/dL (ref 3.5–5.2)
Alkaline Phosphatase: 50 U/L (ref 39–117)
BUN: 15 mg/dL (ref 6–23)
CO2: 30 mEq/L (ref 19–32)
Calcium: 9.4 mg/dL (ref 8.4–10.5)
Chloride: 103 mEq/L (ref 96–112)
Creatinine, Ser: 0.7 mg/dL (ref 0.40–1.20)
GFR: 87.57 mL/min (ref >60.00)
Glucose, Bld: 99 mg/dL (ref 70–99)
Potassium: 3.9 mEq/L (ref 3.5–5.1)
Sodium: 141 mEq/L (ref 135–145)
Total Bilirubin: 0.6 mg/dL (ref 0.2–1.2)
Total Protein: 6.9 g/dL (ref 6.0–8.3)

## 2022-04-14 LAB — LIPID PANEL
Cholesterol: 218 mg/dL — ABNORMAL HIGH (ref 0–200)
HDL: 47.2 mg/dL (ref >39.00)
LDL Cholesterol: 149 mg/dL — ABNORMAL HIGH (ref 0–99)
NonHDL: 170.8
Total CHOL/HDL Ratio: 5
Triglycerides: 108 mg/dL (ref 0.0–149.0)
VLDL: 21.6 mg/dL (ref 0.0–40.0)

## 2022-04-14 LAB — HEMOGLOBIN A1C: Hgb A1c MFr Bld: 5.9 % (ref 4.6–6.5)

## 2022-04-14 LAB — TSH: TSH: 1.15 u[IU]/mL (ref 0.35–5.50)

## 2022-04-14 MED ORDER — OMEPRAZOLE 40 MG PO CPDR: 40.0000 mg | DELAYED_RELEASE_CAPSULE | Freq: Every day | ORAL | 1 refills | Status: DC

## 2022-04-14 NOTE — Assessment & Plan Note (Signed)
We discussed the importance of regular physical activity and healthy diet for prevention of chronic illness and/or complications. Preventive guidelines reviewed. Continue her female preventive care with gynecologist. Vaccination is up-to-date.  Holding on flu vaccine because she received allergy shot today, she was instructed to wait on vaccination for 3 days. Ca++ and vit D supplementation to continue. Next CPE in a year.

## 2022-04-14 NOTE — Patient Instructions (Addendum)
A few things to remember from today's visit:  Routine general medical examination at a health care facility  Hyperlipidemia, unspecified hyperlipidemia type - Plan: Comprehensive metabolic panel, Lipid panel  Gastroesophageal reflux disease, unspecified whether esophagitis present - Plan: omeprazole (PRILOSEC) 40 MG capsule  Thyroid nodule - Plan: TSH, US THYROID  Elevated glucose - Plan: Hemoglobin A1c  Let's try Omeprazole 40 mg 30 min before breakfast for 6-8 weeks.  Preventive Care 54 Years and Older, Female Preventive care refers to lifestyle choices and visits with your health care provider that can promote health and wellness. Preventive care visits are also called wellness exams. What can I expect for my preventive care visit? Counseling Your health care provider may ask you questions about your: Medical history, including: Past medical problems. Family medical history. Pregnancy and menstrual history. History of falls. Current health, including: Memory and ability to understand (cognition). Emotional well-being. Home life and relationship well-being. Sexual activity and sexual health. Lifestyle, including: Alcohol, nicotine or tobacco, and drug use. Access to firearms. Diet, exercise, and sleep habits. Work and work Statistician. Sunscreen use. Safety issues such as seatbelt and bike helmet use. Physical exam Your health care provider will check your: Height and weight. These may be used to calculate your BMI (body mass index). BMI is a measurement that tells if you are at a healthy weight. Waist circumference. This measures the distance around your waistline. This measurement also tells if you are at a healthy weight and may help predict your risk of certain diseases, such as type 2 diabetes and high blood pressure. Heart rate and blood pressure. Body temperature. Skin for abnormal spots. What immunizations do I need?  Vaccines are usually given at various ages,  according to a schedule. Your health care provider will recommend vaccines for you based on your age, medical history, and lifestyle or other factors, such as travel or where you work. What tests do I need? Screening Your health care provider may recommend screening tests for certain conditions. This may include: Lipid and cholesterol levels. Hepatitis C test. Hepatitis B test. HIV (human immunodeficiency virus) test. STI (sexually transmitted infection) testing, if you are at risk. Lung cancer screening. Colorectal cancer screening. Diabetes screening. This is done by checking your blood sugar (glucose) after you have not eaten for a while (fasting). Mammogram. Talk with your health care provider about how often you should have regular mammograms. BRCA-related cancer screening. This may be done if you have a family history of breast, ovarian, tubal, or peritoneal cancers. Bone density scan. This is done to screen for osteoporosis. Talk with your health care provider about your test results, treatment options, and if necessary, the need for more tests. Follow these instructions at home: Eating and drinking  Eat a diet that includes fresh fruits and vegetables, whole grains, lean protein, and low-fat dairy products. Limit your intake of foods with high amounts of sugar, saturated fats, and salt. Take vitamin and mineral supplements as recommended by your health care provider. Do not drink alcohol if your health care provider tells you not to drink. If you drink alcohol: Limit how much you have to 0-1 drink a day. Know how much alcohol is in your drink. In the [place] one drink equals one 12 oz bottle of beer (355 mL), one 5 oz glass of wine (148 mL), or one 1 oz glass of hard liquor (44 mL). Lifestyle Brush your teeth every morning and night with fluoride toothpaste. Floss one time each day. Exercise  for at least 30 minutes 5 or more days each week. Do not use any products that contain  nicotine or tobacco. These products include cigarettes, chewing tobacco, and vaping devices, such as e-cigarettes. If you need help quitting, ask your health care provider. Do not use drugs. If you are sexually active, practice safe sex. Use a condom or other form of protection in order to prevent STIs. Take aspirin only as told by your health care provider. Make sure that you understand how much to take and what form to take. Work with your health care provider to find out whether it is safe and beneficial for you to take aspirin daily. Ask your health care provider if you need to take a cholesterol-lowering medicine (statin). Find healthy ways to manage stress, such as: Meditation, yoga, or listening to music. Journaling. Talking to a trusted person. Spending time with friends and family. Minimize exposure to UV radiation to reduce your risk of skin cancer. Safety Always wear your seat belt while driving or riding in a vehicle. Do not drive: If you have been drinking alcohol. Do not ride with someone who has been drinking. When you are tired or distracted. While texting. If you have been using any mind-altering substances or drugs. Wear a helmet and other protective equipment during sports activities. If you have firearms in your house, make sure you follow all gun safety procedures. What's next? Visit your health care provider once a year for an annual wellness visit. Ask your health care provider how often you should have your eyes and teeth checked. Stay up to date on all vaccines. This information is not intended to replace advice given to you by your health care provider. Make sure you discuss any questions you have with your health care provider. Document Revised: 09/15/2020 Document Reviewed: 09/15/2020 Elsevier Patient Education  [employer].  If you need refills for medications you take chronically, please call your pharmacy. Do not use My Chart to request refills or  for acute issues that need immediate attention. If you send a my chart message, it may take a few days to be addressed, specially if I am not in the office.  Please be sure medication list is accurate. If a new problem present, please set up appointment sooner than planned today.

## 2022-04-14 NOTE — Assessment & Plan Note (Signed)
Non pharmacologic treatment recommended for now. Further recommendations will be given according to 10 years CVD risk score and lipid panel numbers. 

## 2022-04-14 NOTE — Assessment & Plan Note (Signed)
We discussed findings on last thyroid US, recommendation was to follow-up every 1 to 2 years with imaging, she would like to proceed now with thyroid US. We will continue following TSH annually.

## 2022-04-14 NOTE — Assessment & Plan Note (Addendum)
Recommend omeprazole 40 mg 30-minute before breakfast for 6 to 8 weeks. Recommend avoiding foods that could aggravate problem, GERD precautions. Avoid NSAID's. Follow-up as needed.

## 2022-04-21 DIAGNOSIS — J3081 Allergic rhinitis due to animal (cat) (dog) hair and dander: Secondary | ICD-10-CM | POA: Diagnosis not present

## 2022-04-21 DIAGNOSIS — J3089 Other allergic rhinitis: Secondary | ICD-10-CM | POA: Diagnosis not present

## 2022-04-27 ENCOUNTER — Telehealth: Payer: Self-pay | Admitting: Family Medicine

## 2022-04-27 DIAGNOSIS — J3081 Allergic rhinitis due to animal (cat) (dog) hair and dander: Secondary | ICD-10-CM | POA: Diagnosis not present

## 2022-04-27 DIAGNOSIS — J301 Allergic rhinitis due to pollen: Secondary | ICD-10-CM | POA: Diagnosis not present

## 2022-04-27 DIAGNOSIS — J3089 Other allergic rhinitis: Secondary | ICD-10-CM | POA: Diagnosis not present

## 2022-04-27 NOTE — Telephone Encounter (Signed)
Patient returning call for results

## 2022-04-28 ENCOUNTER — Other Ambulatory Visit: Payer: Self-pay

## 2022-04-28 MED ORDER — PRAVASTATIN SODIUM 10 MG PO TABS: 10.0000 mg | ORAL_TABLET | Freq: Every day | ORAL | 1 refills | Status: DC

## 2022-04-28 NOTE — Telephone Encounter (Signed)
See result note.  

## 2022-05-05 DIAGNOSIS — J3089 Other allergic rhinitis: Secondary | ICD-10-CM | POA: Diagnosis not present

## 2022-05-05 DIAGNOSIS — J3081 Allergic rhinitis due to animal (cat) (dog) hair and dander: Secondary | ICD-10-CM | POA: Diagnosis not present

## 2022-05-08 DIAGNOSIS — H40023 Open angle with borderline findings, high risk, bilateral: Secondary | ICD-10-CM | POA: Diagnosis not present

## 2022-05-10 DIAGNOSIS — J3081 Allergic rhinitis due to animal (cat) (dog) hair and dander: Secondary | ICD-10-CM | POA: Diagnosis not present

## 2022-05-10 DIAGNOSIS — R438 Other disturbances of smell and taste: Secondary | ICD-10-CM | POA: Diagnosis not present

## 2022-05-10 DIAGNOSIS — J342 Deviated nasal septum: Secondary | ICD-10-CM | POA: Diagnosis not present

## 2022-05-10 DIAGNOSIS — J3089 Other allergic rhinitis: Secondary | ICD-10-CM | POA: Diagnosis not present

## 2022-05-15 ENCOUNTER — Ambulatory Visit
Admission: RE | Admit: 2022-05-15 | Discharge: 2022-05-15 | Disposition: A | Discharge status: Home / Self Care | Payer: BC Managed Care – PPO | Source: Ambulatory Visit | Attending: Family Medicine | Admitting: Family Medicine

## 2022-05-15 DIAGNOSIS — E042 Nontoxic multinodular goiter: Secondary | ICD-10-CM

## 2022-05-17 DIAGNOSIS — J342 Deviated nasal septum: Secondary | ICD-10-CM | POA: Diagnosis not present

## 2022-05-17 DIAGNOSIS — J343 Hypertrophy of nasal turbinates: Secondary | ICD-10-CM | POA: Diagnosis not present

## 2022-05-17 DIAGNOSIS — J310 Chronic rhinitis: Secondary | ICD-10-CM | POA: Diagnosis not present

## 2022-05-17 DIAGNOSIS — H903 Sensorineural hearing loss, bilateral: Secondary | ICD-10-CM | POA: Diagnosis not present

## 2022-05-19 DIAGNOSIS — J301 Allergic rhinitis due to pollen: Secondary | ICD-10-CM | POA: Diagnosis not present

## 2022-05-19 DIAGNOSIS — J3089 Other allergic rhinitis: Secondary | ICD-10-CM | POA: Diagnosis not present

## 2022-05-19 DIAGNOSIS — J3081 Allergic rhinitis due to animal (cat) (dog) hair and dander: Secondary | ICD-10-CM | POA: Diagnosis not present

## 2022-05-26 DIAGNOSIS — J301 Allergic rhinitis due to pollen: Secondary | ICD-10-CM | POA: Diagnosis not present

## 2022-05-26 DIAGNOSIS — J3081 Allergic rhinitis due to animal (cat) (dog) hair and dander: Secondary | ICD-10-CM | POA: Diagnosis not present

## 2022-05-26 DIAGNOSIS — J3089 Other allergic rhinitis: Secondary | ICD-10-CM | POA: Diagnosis not present

## 2022-06-01 DIAGNOSIS — M25551 Pain in right hip: Secondary | ICD-10-CM | POA: Diagnosis not present

## 2022-06-02 DIAGNOSIS — J3081 Allergic rhinitis due to animal (cat) (dog) hair and dander: Secondary | ICD-10-CM | POA: Diagnosis not present

## 2022-06-02 DIAGNOSIS — J3089 Other allergic rhinitis: Secondary | ICD-10-CM | POA: Diagnosis not present

## 2022-06-16 DIAGNOSIS — J3089 Other allergic rhinitis: Secondary | ICD-10-CM | POA: Diagnosis not present

## 2022-06-16 DIAGNOSIS — J3081 Allergic rhinitis due to animal (cat) (dog) hair and dander: Secondary | ICD-10-CM | POA: Diagnosis not present

## 2022-06-16 DIAGNOSIS — J301 Allergic rhinitis due to pollen: Secondary | ICD-10-CM | POA: Diagnosis not present

## 2022-06-21 ENCOUNTER — Other Ambulatory Visit: Payer: Self-pay | Admitting: Family Medicine

## 2022-06-21 DIAGNOSIS — K219 Gastro-esophageal reflux disease without esophagitis: Secondary | ICD-10-CM

## 2022-06-23 DIAGNOSIS — J3089 Other allergic rhinitis: Secondary | ICD-10-CM | POA: Diagnosis not present

## 2022-06-23 DIAGNOSIS — J301 Allergic rhinitis due to pollen: Secondary | ICD-10-CM | POA: Diagnosis not present

## 2022-07-10 DIAGNOSIS — J301 Allergic rhinitis due to pollen: Secondary | ICD-10-CM | POA: Diagnosis not present

## 2022-07-10 DIAGNOSIS — J3089 Other allergic rhinitis: Secondary | ICD-10-CM | POA: Diagnosis not present

## 2022-07-10 DIAGNOSIS — J3081 Allergic rhinitis due to animal (cat) (dog) hair and dander: Secondary | ICD-10-CM | POA: Diagnosis not present

## 2022-07-28 DIAGNOSIS — J3081 Allergic rhinitis due to animal (cat) (dog) hair and dander: Secondary | ICD-10-CM | POA: Diagnosis not present

## 2022-07-28 DIAGNOSIS — J301 Allergic rhinitis due to pollen: Secondary | ICD-10-CM | POA: Diagnosis not present

## 2022-07-28 DIAGNOSIS — J3089 Other allergic rhinitis: Secondary | ICD-10-CM | POA: Diagnosis not present

## 2022-07-31 ENCOUNTER — Telehealth: Payer: Self-pay

## 2022-07-31 DIAGNOSIS — E785 Hyperlipidemia, unspecified: Secondary | ICD-10-CM

## 2022-07-31 NOTE — Telephone Encounter (Signed)
I called and spoke with patient to schedule her lab appointment. She stopped taking the cholesterol medication after a week due to side effects. Patient will have blood work re-done at her next physical.

## 2022-07-31 NOTE — Telephone Encounter (Signed)
-----   Message from Kathreen Devoid, CMA sent at 04/28/2022 10:37 AM EST ----- Lipid & ALT in 3 months.

## 2022-08-01 NOTE — Progress Notes (Unsigned)
   ACUTE VISIT No chief complaint on file.  HPI: [name] is a 72 y.o. female, who is here today complaining of *** HPI  Review of Systems See other pertinent positives and negatives in HPI.  Current Outpatient Medications on File Prior to Visit  Medication Sig Dispense Refill  . pravastatin (PRAVACHOL) 10 MG tablet Take 1 tablet (10 mg total) by mouth daily. 90 tablet 1  . betamethasone dipropionate (DIPROLENE) 0.05 % cream Apply 1 application daily as needed topically (rash on elbow).     . Calcium Carb-Cholecalciferol (CALCIUM + D3) 600-200 MG-UNIT TABS Take 1 tablet by mouth daily.    Marland Kitchen EPINEPHrine 0.3 mg/0.3 mL IJ SOAJ injection epinephrine 0.3 mg/0.3 mL injection, auto-injector    . estradiol (ESTRACE) 0.1 MG/GM vaginal cream Place 0.5 g vaginally 2 (two) times a week.    Marland Kitchen Fexofenadine HCl (ALLEGRA ALLERGY PO) Allegra Allergy    . fluticasone (FLONASE SENSIMIST) 27.5 MCG/SPRAY nasal spray Place 2 sprays into the nose daily.    Marland Kitchen ibuprofen (ADVIL,MOTRIN) 200 MG tablet Take 200 mg at bedtime by mouth.     Marland Kitchen omeprazole (PRILOSEC) 40 MG capsule TAKE 1 CAPSULE(40 MG) BY MOUTH DAILY 30 capsule 1  . Probiotic Product (PROBIOTIC PO) Take 1 tablet by mouth daily.     No current facility-administered medications on file prior to visit.    Past Medical History:  Diagnosis Date  . Allergy   . Arthritis   . Cancer ([facility])    basal cell skin cancer   Allergies  Allergen Reactions  . Cat Hair Extract Itching  . Other Itching  . Cephalexin Rash  . Adhesive [Tape] Other (See Comments)    Rash/ reddness    Social History   Socioeconomic History  . Marital status: Married    Spouse name: Not on file  . Number of children: Not on file  . Years of education: Not on file  . Highest education level: Not on file  Occupational History  . Not on file  Tobacco Use  . Smoking status: Former  . Smokeless tobacco: Never  . Tobacco comments:    quit 30 + years ago  Vaping Use   . Vaping Use: Former  Substance and Sexual Activity  . Alcohol use: No  . Drug use: No  . Sexual activity: Yes    Partners: Male  Other Topics Concern  . Not on file  Social History Narrative  . Not on file   Social Determinants of Health   Financial Resource Strain: Not on file  Food Insecurity: Not on file  Transportation Needs: Not on file  Physical Activity: Not on file  Stress: Not on file  Social Connections: Not on file    There were no vitals filed for this visit. There is no height or weight on file to calculate BMI.  Physical Exam  ASSESSMENT AND PLAN: There are no diagnoses linked to this encounter.  No follow-ups on file.  Stephanie Henry G. [place], MD  [facility]. [place] office.  Discharge Instructions   None

## 2022-08-02 ENCOUNTER — Ambulatory Visit (INDEPENDENT_AMBULATORY_CARE_PROVIDER_SITE_OTHER): Payer: BC Managed Care – PPO | Admitting: Family Medicine

## 2022-08-02 ENCOUNTER — Encounter: Payer: Self-pay | Admitting: Family Medicine

## 2022-08-02 VITALS — BP 110/70 | HR 75 | Temp 98.3°F | Resp 16 | Ht 64.5 in | Wt 155.2 lb

## 2022-08-02 DIAGNOSIS — L509 Urticaria, unspecified: Secondary | ICD-10-CM | POA: Diagnosis not present

## 2022-08-02 DIAGNOSIS — W57XXXA Bitten or stung by nonvenomous insect and other nonvenomous arthropods, initial encounter: Secondary | ICD-10-CM | POA: Diagnosis not present

## 2022-08-02 DIAGNOSIS — J301 Allergic rhinitis due to pollen: Secondary | ICD-10-CM | POA: Diagnosis not present

## 2022-08-02 DIAGNOSIS — J3089 Other allergic rhinitis: Secondary | ICD-10-CM | POA: Diagnosis not present

## 2022-08-02 DIAGNOSIS — S70362A Insect bite (nonvenomous), left thigh, initial encounter: Secondary | ICD-10-CM

## 2022-08-02 DIAGNOSIS — J3081 Allergic rhinitis due to animal (cat) (dog) hair and dander: Secondary | ICD-10-CM | POA: Diagnosis not present

## 2022-08-02 MED ORDER — TRIAMCINOLONE ACETONIDE 0.1 % EX CREA: 1.0000 | TOPICAL_CREAM | Freq: Two times a day (BID) | CUTANEOUS | 0 refills | Status: AC

## 2022-08-02 MED ORDER — DOXYCYCLINE HYCLATE 100 MG PO TABS: 100.0000 mg | ORAL_TABLET | Freq: Two times a day (BID) | ORAL | 0 refills | Status: AC

## 2022-08-02 NOTE — Patient Instructions (Signed)
A few things to remember from today's visit:  Tick bite of left thigh, initial encounter - Plan: doxycycline (VIBRA-TABS) 100 MG tablet  Localized hives - Plan: triamcinolone cream (KENALOG) 0.1 % Monitor for new symptoms If needed we can run labs in 2-3 weeks.  If you need refills for medications you take chronically, please call your pharmacy. Do not use My Chart to request refills or for acute issues that need immediate attention. If you send a my chart message, it may take a few days to be addressed, specially if I am not in the office.  Please be sure medication list is accurate. If a new problem present, please set up appointment sooner than planned today.

## 2022-08-09 DIAGNOSIS — J3089 Other allergic rhinitis: Secondary | ICD-10-CM | POA: Diagnosis not present

## 2022-08-09 DIAGNOSIS — J3081 Allergic rhinitis due to animal (cat) (dog) hair and dander: Secondary | ICD-10-CM | POA: Diagnosis not present

## 2022-08-11 DIAGNOSIS — J301 Allergic rhinitis due to pollen: Secondary | ICD-10-CM | POA: Diagnosis not present

## 2022-08-11 DIAGNOSIS — J3089 Other allergic rhinitis: Secondary | ICD-10-CM | POA: Diagnosis not present

## 2022-08-11 DIAGNOSIS — J3081 Allergic rhinitis due to animal (cat) (dog) hair and dander: Secondary | ICD-10-CM | POA: Diagnosis not present

## 2022-08-18 DIAGNOSIS — J301 Allergic rhinitis due to pollen: Secondary | ICD-10-CM | POA: Diagnosis not present

## 2022-08-18 DIAGNOSIS — J3089 Other allergic rhinitis: Secondary | ICD-10-CM | POA: Diagnosis not present

## 2022-08-18 DIAGNOSIS — J3081 Allergic rhinitis due to animal (cat) (dog) hair and dander: Secondary | ICD-10-CM | POA: Diagnosis not present

## 2022-08-24 DIAGNOSIS — J3081 Allergic rhinitis due to animal (cat) (dog) hair and dander: Secondary | ICD-10-CM | POA: Diagnosis not present

## 2022-08-24 DIAGNOSIS — J3089 Other allergic rhinitis: Secondary | ICD-10-CM | POA: Diagnosis not present

## 2022-09-05 DIAGNOSIS — J3089 Other allergic rhinitis: Secondary | ICD-10-CM | POA: Diagnosis not present

## 2022-09-05 DIAGNOSIS — J3081 Allergic rhinitis due to animal (cat) (dog) hair and dander: Secondary | ICD-10-CM | POA: Diagnosis not present

## 2022-09-05 DIAGNOSIS — J301 Allergic rhinitis due to pollen: Secondary | ICD-10-CM | POA: Diagnosis not present

## 2022-09-18 ENCOUNTER — Other Ambulatory Visit: Payer: Self-pay | Admitting: Family Medicine

## 2022-09-18 DIAGNOSIS — K219 Gastro-esophageal reflux disease without esophagitis: Secondary | ICD-10-CM

## 2022-09-27 DIAGNOSIS — J3081 Allergic rhinitis due to animal (cat) (dog) hair and dander: Secondary | ICD-10-CM | POA: Diagnosis not present

## 2022-09-27 DIAGNOSIS — J3089 Other allergic rhinitis: Secondary | ICD-10-CM | POA: Diagnosis not present

## 2022-10-04 DIAGNOSIS — J3089 Other allergic rhinitis: Secondary | ICD-10-CM | POA: Diagnosis not present

## 2022-10-04 DIAGNOSIS — J3081 Allergic rhinitis due to animal (cat) (dog) hair and dander: Secondary | ICD-10-CM | POA: Diagnosis not present

## 2022-10-10 DIAGNOSIS — J3089 Other allergic rhinitis: Secondary | ICD-10-CM | POA: Diagnosis not present

## 2022-10-10 DIAGNOSIS — J301 Allergic rhinitis due to pollen: Secondary | ICD-10-CM | POA: Diagnosis not present

## 2022-10-10 DIAGNOSIS — J3081 Allergic rhinitis due to animal (cat) (dog) hair and dander: Secondary | ICD-10-CM | POA: Diagnosis not present

## 2022-10-17 DIAGNOSIS — J3081 Allergic rhinitis due to animal (cat) (dog) hair and dander: Secondary | ICD-10-CM | POA: Diagnosis not present

## 2022-10-17 DIAGNOSIS — J301 Allergic rhinitis due to pollen: Secondary | ICD-10-CM | POA: Diagnosis not present

## 2022-10-17 DIAGNOSIS — J3089 Other allergic rhinitis: Secondary | ICD-10-CM | POA: Diagnosis not present

## 2022-10-27 DIAGNOSIS — J3089 Other allergic rhinitis: Secondary | ICD-10-CM | POA: Diagnosis not present

## 2022-10-27 DIAGNOSIS — J301 Allergic rhinitis due to pollen: Secondary | ICD-10-CM | POA: Diagnosis not present

## 2022-10-27 DIAGNOSIS — J3081 Allergic rhinitis due to animal (cat) (dog) hair and dander: Secondary | ICD-10-CM | POA: Diagnosis not present

## 2022-11-01 DIAGNOSIS — J3081 Allergic rhinitis due to animal (cat) (dog) hair and dander: Secondary | ICD-10-CM | POA: Diagnosis not present

## 2022-11-01 DIAGNOSIS — J3089 Other allergic rhinitis: Secondary | ICD-10-CM | POA: Diagnosis not present

## 2022-11-01 DIAGNOSIS — J301 Allergic rhinitis due to pollen: Secondary | ICD-10-CM | POA: Diagnosis not present

## 2022-11-07 DIAGNOSIS — J3081 Allergic rhinitis due to animal (cat) (dog) hair and dander: Secondary | ICD-10-CM | POA: Diagnosis not present

## 2022-11-07 DIAGNOSIS — J301 Allergic rhinitis due to pollen: Secondary | ICD-10-CM | POA: Diagnosis not present

## 2022-11-07 DIAGNOSIS — J3089 Other allergic rhinitis: Secondary | ICD-10-CM | POA: Diagnosis not present

## 2022-11-13 DIAGNOSIS — J301 Allergic rhinitis due to pollen: Secondary | ICD-10-CM | POA: Diagnosis not present

## 2022-11-13 DIAGNOSIS — J3081 Allergic rhinitis due to animal (cat) (dog) hair and dander: Secondary | ICD-10-CM | POA: Diagnosis not present

## 2022-11-13 DIAGNOSIS — J3089 Other allergic rhinitis: Secondary | ICD-10-CM | POA: Diagnosis not present

## 2022-11-22 DIAGNOSIS — J3089 Other allergic rhinitis: Secondary | ICD-10-CM | POA: Diagnosis not present

## 2022-11-22 DIAGNOSIS — J301 Allergic rhinitis due to pollen: Secondary | ICD-10-CM | POA: Diagnosis not present

## 2022-11-22 DIAGNOSIS — J3081 Allergic rhinitis due to animal (cat) (dog) hair and dander: Secondary | ICD-10-CM | POA: Diagnosis not present

## 2022-11-30 DIAGNOSIS — J3089 Other allergic rhinitis: Secondary | ICD-10-CM | POA: Diagnosis not present

## 2022-11-30 DIAGNOSIS — J3081 Allergic rhinitis due to animal (cat) (dog) hair and dander: Secondary | ICD-10-CM | POA: Diagnosis not present

## 2022-12-05 DIAGNOSIS — J3089 Other allergic rhinitis: Secondary | ICD-10-CM | POA: Diagnosis not present

## 2022-12-05 DIAGNOSIS — J3081 Allergic rhinitis due to animal (cat) (dog) hair and dander: Secondary | ICD-10-CM | POA: Diagnosis not present

## 2022-12-13 DIAGNOSIS — D2262 Melanocytic nevi of left upper limb, including shoulder: Secondary | ICD-10-CM | POA: Diagnosis not present

## 2022-12-13 DIAGNOSIS — D2272 Melanocytic nevi of left lower limb, including hip: Secondary | ICD-10-CM | POA: Diagnosis not present

## 2022-12-13 DIAGNOSIS — D225 Melanocytic nevi of trunk: Secondary | ICD-10-CM | POA: Diagnosis not present

## 2022-12-13 DIAGNOSIS — Z85828 Personal history of other malignant neoplasm of skin: Secondary | ICD-10-CM | POA: Diagnosis not present

## 2022-12-15 DIAGNOSIS — J301 Allergic rhinitis due to pollen: Secondary | ICD-10-CM | POA: Diagnosis not present

## 2022-12-15 DIAGNOSIS — J3089 Other allergic rhinitis: Secondary | ICD-10-CM | POA: Diagnosis not present

## 2022-12-15 DIAGNOSIS — J3081 Allergic rhinitis due to animal (cat) (dog) hair and dander: Secondary | ICD-10-CM | POA: Diagnosis not present

## 2022-12-28 DIAGNOSIS — J3081 Allergic rhinitis due to animal (cat) (dog) hair and dander: Secondary | ICD-10-CM | POA: Diagnosis not present

## 2022-12-28 DIAGNOSIS — J3089 Other allergic rhinitis: Secondary | ICD-10-CM | POA: Diagnosis not present

## 2022-12-28 DIAGNOSIS — J301 Allergic rhinitis due to pollen: Secondary | ICD-10-CM | POA: Diagnosis not present

## 2023-01-04 DIAGNOSIS — J3089 Other allergic rhinitis: Secondary | ICD-10-CM | POA: Diagnosis not present

## 2023-01-04 DIAGNOSIS — J3081 Allergic rhinitis due to animal (cat) (dog) hair and dander: Secondary | ICD-10-CM | POA: Diagnosis not present

## 2023-01-04 DIAGNOSIS — J301 Allergic rhinitis due to pollen: Secondary | ICD-10-CM | POA: Diagnosis not present

## 2023-01-10 ENCOUNTER — Encounter: Payer: Self-pay | Admitting: Family Medicine

## 2023-01-10 ENCOUNTER — Ambulatory Visit (INDEPENDENT_AMBULATORY_CARE_PROVIDER_SITE_OTHER): Payer: BC Managed Care – PPO | Admitting: Family Medicine

## 2023-01-10 VITALS — BP 130/75 | HR 67 | Temp 98.5°F | Resp 16 | Ht 64.5 in | Wt 157.8 lb

## 2023-01-10 DIAGNOSIS — R030 Elevated blood-pressure reading, without diagnosis of hypertension: Secondary | ICD-10-CM

## 2023-01-10 DIAGNOSIS — H6991 Unspecified Eustachian tube disorder, right ear: Secondary | ICD-10-CM

## 2023-01-10 DIAGNOSIS — Z23 Encounter for immunization: Secondary | ICD-10-CM | POA: Diagnosis not present

## 2023-01-10 NOTE — Patient Instructions (Addendum)
A few things to remember from today's visit:  Elevated blood pressure reading  Dysfunction of right eustachian tube  Popping the ears a few times during the day and decongestants for short period of time may help. Caution with decongestants because can elevated blood pressure.  If you need refills for medications you take chronically, please call your pharmacy. Do not use My Chart to request refills or for acute issues that need immediate attention. If you send a my chart message, it may take a few days to be addressed, specially if I am not in the office.  Please be sure medication list is accurate. If a new problem present, please set up appointment sooner than planned today.

## 2023-01-10 NOTE — Progress Notes (Signed)
ACUTE VISIT Chief Complaint  Patient presents with   Ear Fullness    LT Ear fullness x2-3 days. Experiencing some nasal drainage and pain of the nose. Also requesting flu shot   HPI: [name] is a 72 y.o. female with a PMHx significant for HLD, allergic rhinitis, GERD, and thyroid nodules, who is here today complaining of nasal drainage and left ear fullness sensation as described above..  She complains of left ear fullness and drainage. She endorses some associated earache, no hearing changes.  Ear Fullness  There is pain in the left ear. This is a new problem. The current episode started in the past 7 days. The problem has been unchanged. There has been no fever. The pain is at a severity of 3/10. The pain is mild. Associated symptoms include rhinorrhea. Pertinent negatives include no abdominal pain, coughing, diarrhea, ear discharge, headaches, hearing loss, neck pain, rash, sore throat or vomiting.   Mild rhinorrhea. She endorses some associated "scratchy" throat, but denies headache, fever, chills, body aches, or recent sick contacts.  She says she is allergic to several things, but denies an allergy to pollen.  Some symptoms started after visiting her mother in a SNF, where they allowed to bring pets, she notes she is allergic to cat and dog hair.  She has been taking allegra and ibuprofen for her symptoms.   Her BP is mildly elevated today. No hx of HTN.  Review of Systems  Constitutional:  Negative for activity change and appetite change.  HENT:  Positive for rhinorrhea. Negative for ear discharge, hearing loss and sore throat.   Respiratory:  Negative for cough, shortness of breath and wheezing.   Cardiovascular:  Negative for chest pain, palpitations and leg swelling.  Gastrointestinal:  Negative for abdominal pain, diarrhea and vomiting.  Genitourinary:  Negative for decreased urine volume and hematuria.  Musculoskeletal:  Negative for neck pain.  Skin:   Negative for rash.  Allergic/Immunologic: Positive for environmental allergies.  Neurological:  Negative for dizziness, syncope, weakness and headaches.  See other pertinent positives and negatives in HPI.  Current Outpatient Medications on File Prior to Visit  Medication Sig Dispense Refill   betamethasone dipropionate (DIPROLENE) 0.05 % cream Apply 1 application daily as needed topically (rash on elbow).      Calcium Carb-Cholecalciferol (CALCIUM + D3) 600-200 MG-UNIT TABS Take 1 tablet by mouth daily.     EPINEPHrine 0.3 mg/0.3 mL IJ SOAJ injection epinephrine 0.3 mg/0.3 mL injection, auto-injector     estradiol (ESTRACE) 0.1 MG/GM vaginal cream Place 0.5 g vaginally 2 (two) times a week.     Fexofenadine HCl (ALLEGRA ALLERGY PO) Allegra Allergy     fluticasone (FLONASE SENSIMIST) 27.5 MCG/SPRAY nasal spray Place 2 sprays into the nose daily.     ibuprofen (ADVIL,MOTRIN) 200 MG tablet Take 200 mg at bedtime by mouth.      omeprazole (PRILOSEC) 40 MG capsule TAKE 1 CAPSULE(40 MG) BY MOUTH DAILY 90 capsule 2   Probiotic Product (PROBIOTIC PO) Take 1 tablet by mouth daily.     No current facility-administered medications on file prior to visit.   Past Medical History:  Diagnosis Date   Allergy    Arthritis    Cancer (HCC)    basal cell skin cancer   Allergies  Allergen Reactions   Cat Hair Extract Itching   Other Itching   Cephalexin Rash   Adhesive [Tape] Other (See Comments)    Rash/ reddness    Social History  Socioeconomic History   Marital status: Married    Spouse name: Not on file   Number of children: Not on file   Years of education: Not on file   Highest education level: Not on file  Occupational History   Not on file  Tobacco Use   Smoking status: Former   Smokeless tobacco: Never   Tobacco comments:    quit 30 + years ago  Vaping Use   Vaping status: Former  Substance and Sexual Activity   Alcohol use: No   Drug use: No   Sexual activity: Yes     Partners: Male  Other Topics Concern   Not on file  Social History Narrative   Not on file   Social Determinants of Health   Financial Resource Strain: Not on file  Food Insecurity: Not on file  Transportation Needs: Not on file  Physical Activity: Not on file  Stress: Not on file  Social Connections: Not on file   Vitals:   01/10/23 1126 01/10/23 1144  BP: (!) 142/78 130/75  Pulse: 67   Resp: 16   Temp: 98.5 F (36.9 C)   SpO2: 98%    Body mass index is 26.67 kg/m.  Physical Exam Vitals and nursing note reviewed.  Constitutional:      General: She is not in acute distress.    Appearance: She is well-developed. She is not ill-appearing.  HENT:     Head: Normocephalic and atraumatic.     Right Ear: Ear canal and external ear normal. A middle ear effusion is present. Tympanic membrane is bulging.     Left Ear: Ear canal and external ear normal. A middle ear effusion is present.     Nose: Rhinorrhea present.     Mouth/Throat:     Mouth: Mucous membranes are moist.     Pharynx: Postnasal drip present. No oropharyngeal exudate or posterior oropharyngeal erythema.  Eyes:     Conjunctiva/sclera: Conjunctivae normal.  Cardiovascular:     Rate and Rhythm: Normal rate and regular rhythm.     Heart sounds: No murmur heard. Pulmonary:     Effort: Pulmonary effort is normal. No respiratory distress.     Breath sounds: Normal breath sounds. No stridor.  Lymphadenopathy:     Head:     Right side of head: No preauricular adenopathy.     Left side of head: No preauricular adenopathy.     Cervical: No cervical adenopathy.  Skin:    General: Skin is warm.     Findings: No erythema or rash.  Neurological:     Mental Status: She is alert and oriented to person, place, and time.  Psychiatric:        Mood and Affect: Mood and affect normal.   ASSESSMENT AND PLAN:  Ms. Garden was seen today for left ear drainage.   Dysfunction of right eustachian tube We discussed possible  etiologies. History and examination today do not suggest an infectious process. Most likely related to seasonal allergies. Recommend auto inflation maneuvers a few times throughout the day. OTC decongestant may also help, we discussed side effects, if trying recommend doing so for 2 to 3 days. Plain Mucinex may also help.  Elevated blood pressure reading Re-checked 130/70. Monitor BP at home.  Need for influenza vaccination -     Flu Vaccine Trivalent High Dose (Fluad)  Return if symptoms worsen or fail to improve, for keep next appointment.  Trula Ore, acting as a Neurosurgeon for [employer]  [place], MD., have documented all relevant documentation on the behalf of Britanie Harshman [place], MD, as directed by  Zorian Gunderman [place], MD while in the presence of Evaline Waltman [place], MD.   I, Jayra Choyce [place], MD, have reviewed all documentation for this visit. The documentation on 01/10/23 for the exam, diagnosis, procedures, and orders are all accurate and complete.  Miski Feldpausch G. [place], MD  [facility] [place] [facility].

## 2023-01-18 DIAGNOSIS — J3081 Allergic rhinitis due to animal (cat) (dog) hair and dander: Secondary | ICD-10-CM | POA: Diagnosis not present

## 2023-01-18 DIAGNOSIS — J3089 Other allergic rhinitis: Secondary | ICD-10-CM | POA: Diagnosis not present

## 2023-01-29 DIAGNOSIS — J3089 Other allergic rhinitis: Secondary | ICD-10-CM | POA: Diagnosis not present

## 2023-01-29 DIAGNOSIS — J301 Allergic rhinitis due to pollen: Secondary | ICD-10-CM | POA: Diagnosis not present

## 2023-01-29 DIAGNOSIS — J3081 Allergic rhinitis due to animal (cat) (dog) hair and dander: Secondary | ICD-10-CM | POA: Diagnosis not present

## 2023-02-14 DIAGNOSIS — J3089 Other allergic rhinitis: Secondary | ICD-10-CM | POA: Diagnosis not present

## 2023-02-14 DIAGNOSIS — J3081 Allergic rhinitis due to animal (cat) (dog) hair and dander: Secondary | ICD-10-CM | POA: Diagnosis not present

## 2023-02-22 DIAGNOSIS — J3081 Allergic rhinitis due to animal (cat) (dog) hair and dander: Secondary | ICD-10-CM | POA: Diagnosis not present

## 2023-02-22 DIAGNOSIS — J301 Allergic rhinitis due to pollen: Secondary | ICD-10-CM | POA: Diagnosis not present

## 2023-02-22 DIAGNOSIS — J3089 Other allergic rhinitis: Secondary | ICD-10-CM | POA: Diagnosis not present

## 2023-02-28 DIAGNOSIS — M25511 Pain in right shoulder: Secondary | ICD-10-CM | POA: Diagnosis not present

## 2023-03-09 DIAGNOSIS — J3081 Allergic rhinitis due to animal (cat) (dog) hair and dander: Secondary | ICD-10-CM | POA: Diagnosis not present

## 2023-03-09 DIAGNOSIS — J3089 Other allergic rhinitis: Secondary | ICD-10-CM | POA: Diagnosis not present

## 2023-03-09 DIAGNOSIS — J301 Allergic rhinitis due to pollen: Secondary | ICD-10-CM | POA: Diagnosis not present

## 2023-03-22 DIAGNOSIS — J3081 Allergic rhinitis due to animal (cat) (dog) hair and dander: Secondary | ICD-10-CM | POA: Diagnosis not present

## 2023-03-22 DIAGNOSIS — J301 Allergic rhinitis due to pollen: Secondary | ICD-10-CM | POA: Diagnosis not present

## 2023-03-22 DIAGNOSIS — J3089 Other allergic rhinitis: Secondary | ICD-10-CM | POA: Diagnosis not present

## 2023-04-05 DIAGNOSIS — R319 Hematuria, unspecified: Secondary | ICD-10-CM | POA: Diagnosis not present

## 2023-04-05 DIAGNOSIS — Z6826 Body mass index (BMI) 26.0-26.9, adult: Secondary | ICD-10-CM | POA: Diagnosis not present

## 2023-04-05 DIAGNOSIS — Z1231 Encounter for screening mammogram for malignant neoplasm of breast: Secondary | ICD-10-CM | POA: Diagnosis not present

## 2023-04-05 DIAGNOSIS — N952 Postmenopausal atrophic vaginitis: Secondary | ICD-10-CM | POA: Diagnosis not present

## 2023-04-05 DIAGNOSIS — Z01419 Encounter for gynecological examination (general) (routine) without abnormal findings: Secondary | ICD-10-CM | POA: Diagnosis not present

## 2023-04-05 LAB — HM MAMMOGRAPHY

## 2023-04-11 DIAGNOSIS — J301 Allergic rhinitis due to pollen: Secondary | ICD-10-CM | POA: Diagnosis not present

## 2023-04-11 DIAGNOSIS — J3089 Other allergic rhinitis: Secondary | ICD-10-CM | POA: Diagnosis not present

## 2023-04-11 DIAGNOSIS — J3081 Allergic rhinitis due to animal (cat) (dog) hair and dander: Secondary | ICD-10-CM | POA: Diagnosis not present

## 2023-04-25 DIAGNOSIS — J3089 Other allergic rhinitis: Secondary | ICD-10-CM | POA: Diagnosis not present

## 2023-04-25 DIAGNOSIS — J3081 Allergic rhinitis due to animal (cat) (dog) hair and dander: Secondary | ICD-10-CM | POA: Diagnosis not present

## 2023-04-26 DIAGNOSIS — M542 Cervicalgia: Secondary | ICD-10-CM | POA: Diagnosis not present

## 2023-04-26 DIAGNOSIS — M25511 Pain in right shoulder: Secondary | ICD-10-CM | POA: Diagnosis not present

## 2023-04-27 DIAGNOSIS — J3081 Allergic rhinitis due to animal (cat) (dog) hair and dander: Secondary | ICD-10-CM | POA: Diagnosis not present

## 2023-04-27 DIAGNOSIS — J3089 Other allergic rhinitis: Secondary | ICD-10-CM | POA: Diagnosis not present

## 2023-04-27 DIAGNOSIS — J301 Allergic rhinitis due to pollen: Secondary | ICD-10-CM | POA: Diagnosis not present

## 2023-05-09 DIAGNOSIS — L570 Actinic keratosis: Secondary | ICD-10-CM | POA: Diagnosis not present

## 2023-05-10 DIAGNOSIS — R438 Other disturbances of smell and taste: Secondary | ICD-10-CM | POA: Diagnosis not present

## 2023-05-10 DIAGNOSIS — J3081 Allergic rhinitis due to animal (cat) (dog) hair and dander: Secondary | ICD-10-CM | POA: Diagnosis not present

## 2023-05-10 DIAGNOSIS — J342 Deviated nasal septum: Secondary | ICD-10-CM | POA: Diagnosis not present

## 2023-05-10 DIAGNOSIS — J301 Allergic rhinitis due to pollen: Secondary | ICD-10-CM | POA: Diagnosis not present

## 2023-05-10 DIAGNOSIS — J3089 Other allergic rhinitis: Secondary | ICD-10-CM | POA: Diagnosis not present

## 2023-05-21 ENCOUNTER — Ambulatory Visit (INDEPENDENT_AMBULATORY_CARE_PROVIDER_SITE_OTHER): Payer: BC Managed Care – PPO | Admitting: Family Medicine

## 2023-05-21 ENCOUNTER — Encounter: Payer: Self-pay | Admitting: Family Medicine

## 2023-05-21 VITALS — BP 126/80 | HR 73 | Temp 97.8°F | Resp 16 | Ht 64.5 in | Wt 157.0 lb

## 2023-05-21 DIAGNOSIS — Z1211 Encounter for screening for malignant neoplasm of colon: Secondary | ICD-10-CM

## 2023-05-21 DIAGNOSIS — Z0000 Encounter for general adult medical examination without abnormal findings: Secondary | ICD-10-CM

## 2023-05-21 DIAGNOSIS — E785 Hyperlipidemia, unspecified: Secondary | ICD-10-CM | POA: Diagnosis not present

## 2023-05-21 DIAGNOSIS — J3089 Other allergic rhinitis: Secondary | ICD-10-CM | POA: Diagnosis not present

## 2023-05-21 DIAGNOSIS — E042 Nontoxic multinodular goiter: Secondary | ICD-10-CM | POA: Diagnosis not present

## 2023-05-21 DIAGNOSIS — R7303 Prediabetes: Secondary | ICD-10-CM | POA: Diagnosis not present

## 2023-05-21 DIAGNOSIS — J301 Allergic rhinitis due to pollen: Secondary | ICD-10-CM | POA: Diagnosis not present

## 2023-05-21 DIAGNOSIS — J3081 Allergic rhinitis due to animal (cat) (dog) hair and dander: Secondary | ICD-10-CM | POA: Diagnosis not present

## 2023-05-21 LAB — COMPREHENSIVE METABOLIC PANEL
ALT: 17 U/L (ref 0–35)
AST: 17 U/L (ref 0–37)
Albumin: 4.4 g/dL (ref 3.5–5.2)
Alkaline Phosphatase: 57 U/L (ref 39–117)
BUN: 13 mg/dL (ref 6–23)
CO2: 30 meq/L (ref 19–32)
Calcium: 9.4 mg/dL (ref 8.4–10.5)
Chloride: 103 meq/L (ref 96–112)
Creatinine, Ser: 0.67 mg/dL (ref 0.40–1.20)
GFR: 87.81 mL/min (ref >60.00)
Glucose, Bld: 104 mg/dL — ABNORMAL HIGH (ref 70–99)
Potassium: 3.7 meq/L (ref 3.5–5.1)
Sodium: 140 meq/L (ref 135–145)
Total Bilirubin: 0.6 mg/dL (ref 0.2–1.2)
Total Protein: 7 g/dL (ref 6.0–8.3)

## 2023-05-21 LAB — LIPID PANEL
Cholesterol: 208 mg/dL — ABNORMAL HIGH (ref 0–200)
HDL: 45.1 mg/dL (ref >39.00)
LDL Cholesterol: 143 mg/dL — ABNORMAL HIGH (ref 0–99)
NonHDL: 162.78
Total CHOL/HDL Ratio: 5
Triglycerides: 100 mg/dL (ref 0.0–149.0)
VLDL: 20 mg/dL (ref 0.0–40.0)

## 2023-05-21 LAB — TSH: TSH: 1.11 u[IU]/mL (ref 0.35–5.50)

## 2023-05-21 LAB — HEMOGLOBIN A1C: Hgb A1c MFr Bld: 5.8 % (ref 4.6–6.5)

## 2023-05-21 NOTE — Assessment & Plan Note (Signed)
 Encouraged consistency with a healthy life style for diabetes prevention. Further recommendations according to HgA1C.

## 2023-05-21 NOTE — Assessment & Plan Note (Signed)
 Continue nonpharmacologic treatment. Further recommendations will be given according to 10 years CVD risk score and lipid panel numbers.

## 2023-05-21 NOTE — Assessment & Plan Note (Signed)
 Due for thyroid US, order placed. We will continue following TSH annually.

## 2023-05-21 NOTE — Patient Instructions (Addendum)
 A few things to remember from today's visit:  Routine general medical examination at a health care facility  Hyperlipidemia, unspecified hyperlipidemia type - Plan: Comprehensive metabolic panel, Lipid panel  Colon cancer screening - Plan: Ambulatory referral to Gastroenterology  Multiple thyroid nodules/multinodular goiter - Plan: US THYROID, TSH  Prediabetes - Plan: Hemoglobin A1c  If you need refills for medications you take chronically, please call your pharmacy. Do not use My Chart to request refills or for acute issues that need immediate attention. If you send a my chart message, it may take a few days to be addressed, specially if I am not in the office.  Please be sure medication list is accurate. If a new problem present, please set up appointment sooner than planned today.

## 2023-05-21 NOTE — Progress Notes (Unsigned)
 HPI: [name] is a 73 y.o. female with a PMHx significant for HLD, allergic rhinitis GERD, and thyroid nodules, who is here today for her routine physical.  Last CPE: 04/14/2022  Exercise: Patient states she is walking on her treadmill 1-2 times per week.  Diet: She says she is eating out sometimes. She eats vegetables daily.  Sleep: 6.5-7.5 hours per night.  Alcohol Use: Very rarely Smoking: She stopped smoking about 40 years ago.  Vision: UTD on routine vision care.  Dental: UTD on routine dental care.   She sees her gynecologist annually.   Immunization History  Administered Date(s) Administered   Fluad Quad(high Dose 65+) 03/23/2020   Fluad Trivalent(High Dose 65+) 01/10/2023   Influenza, High Dose Seasonal PF 01/17/2016, 12/08/2016, 04/27/2018, 01/07/2019, 04/29/2019, 06/08/2020   Influenza-Unspecified 02/21/2017, 01/18/2019, 01/01/2021   PFIZER(Purple Top)SARS-COV-2 Vaccination 04/14/2019, 05/05/2019, 11/19/2019, 06/08/2020   Pfizer Covid-19 Vaccine Bivalent Booster 18yrs & up 01/31/2021   Pneumococcal Conjugate-13 12/08/2016   Pneumococcal Polysaccharide-23 10/18/2018, 04/29/2019, 06/08/2020   Tdap 01/01/2013   Zoster Recombinant(Shingrix) 04/06/2021, 06/21/2021   Health Maintenance  Topic Date Due   COVID-19 Vaccine (6 - 2024-25 season) 12/03/2022   MAMMOGRAM  03/04/2023   Colonoscopy  06/04/2023   Pneumonia Vaccine 60+ Years old  Completed   INFLUENZA VACCINE  Completed   DEXA SCAN  Completed   Hepatitis C Screening  Completed   Zoster Vaccines- Shingrix  Completed   HPV VACCINES  Aged Out   DTaP/Tdap/Td  Discontinued   Chronic medical problems:   GERD:  Currently on omeprazole 40 mg daily.   Hyperlipidemia: Not currently on pharmacologic treatment.  Lab Results  Component Value Date   CHOL 218 (H) 04/14/2022   HDL 47.20 04/14/2022   LDLCALC 149 (H) 04/14/2022   TRIG 108.0 04/14/2022   CHOLHDL 5 04/14/2022   She is taking vitamin D  supplementation and trying to get calcium through her diet.  Her last DEXA scan was 2 years ago through her gynecologist's office.   Patient also mentions right shoulder pain. Endorses some tingling in RUE.  She takes ibuprofen 200 mg at bedtime for the pain.  She is following with orthopedics and a chiropractor.   She Follows with immunologist and on immunotherapy.. Has an annual appointment next week with ENT.   Multinodular goiter. Last TSH 04/2022 was 1.15 Last thyroid US in 05/2022: 1. Similar appearing multinodular goiter. 2. Unchanged isthmus nodule (labeled 1, 1.0 cm, previously 1.1 cm). This nodule meets criteria (TI-RADS category 4) for 1 year ultrasound surveillance. This nodule demonstrates 4 years stability.  Prediabetes: Last HgA1C was 5.9 in 04/2022.  Review of Systems  Constitutional:  Negative for activity change, appetite change and fever.  HENT:  Negative for hearing loss, mouth sores, sore throat and trouble swallowing.   Eyes:  Negative for redness and visual disturbance.  Respiratory:  Negative for cough, shortness of breath and wheezing.   Cardiovascular:  Negative for chest pain and leg swelling.  Gastrointestinal:  Negative for abdominal pain, nausea and vomiting.  Endocrine: Negative for cold intolerance, heat intolerance, polydipsia, polyphagia and polyuria.  Genitourinary:  Negative for decreased urine volume, dysuria and hematuria.  Musculoskeletal:  Positive for arthralgias and neck pain. Negative for gait problem.  Skin:  Negative for color change and rash.  Allergic/Immunologic: Positive for environmental allergies.  Neurological:  Negative for syncope, weakness and headaches.  Hematological:  Negative for adenopathy. Does not bruise/bleed easily.  Psychiatric/Behavioral:  Negative for confusion and hallucinations.  All other systems reviewed and are negative.  Current Outpatient Medications on File Prior to Visit  Medication Sig Dispense Refill    betamethasone dipropionate (DIPROLENE) 0.05 % cream Apply 1 application daily as needed topically (rash on elbow).      Calcium Carb-Cholecalciferol (CALCIUM + D3) 600-200 MG-UNIT TABS Take 1 tablet by mouth daily.     EPINEPHrine 0.3 mg/0.3 mL IJ SOAJ injection epinephrine 0.3 mg/0.3 mL injection, auto-injector     estradiol (ESTRACE) 0.1 MG/GM vaginal cream Place 0.5 g vaginally 2 (two) times a week.     Fexofenadine HCl (ALLEGRA ALLERGY PO) Allegra Allergy     fluticasone (FLONASE SENSIMIST) 27.5 MCG/SPRAY nasal spray Place 2 sprays into the nose daily.     ibuprofen (ADVIL,MOTRIN) 200 MG tablet Take 200 mg at bedtime by mouth.      omeprazole (PRILOSEC) 40 MG capsule TAKE 1 CAPSULE(40 MG) BY MOUTH DAILY 90 capsule 2   Probiotic Product (PROBIOTIC PO) Take 1 tablet by mouth daily.     No current facility-administered medications on file prior to visit.   Past Medical History:  Diagnosis Date   Allergy    Arthritis    Cancer (HCC)    basal cell skin cancer   Past Surgical History:  Procedure Laterality Date   CESAREAN SECTION     x3   CHOLECYSTECTOMY     LIPOMA EXCISION N/A 02/14/2017   Procedure: EXCISION POSTERIOR NECK CYST;  Surgeon: Abigail Miyamoto, MD;  Location: [facility] OR;  Service: General;  Laterality: N/A;   NASAL RECONSTRUCTION     PARTIAL HYSTERECTOMY     TONSILLECTOMY      Allergies  Allergen Reactions   Cat Dander Itching   Other Itching   Cephalexin Rash   Adhesive [Tape] Other (See Comments)    Rash/ reddness   Family History  Problem Relation Age of Onset   Hypertension Mother    Heart disease Father        CAD   Hypertension Father    Colon cancer Neg Hx    Social History   Socioeconomic History   Marital status: Married    Spouse name: Not on file   Number of children: Not on file   Years of education: Not on file   Highest education level: Not on file  Occupational History   Not on file  Tobacco Use   Smoking status: Former   Smokeless  tobacco: Never   Tobacco comments:    quit 30 + years ago  Vaping Use   Vaping status: Former  Substance and Sexual Activity   Alcohol use: No   Drug use: No   Sexual activity: Yes    Partners: Male  Other Topics Concern   Not on file  Social History Narrative   Not on file   Social Drivers of Health   Financial Resource Strain: Not on file  Food Insecurity: Not on file  Transportation Needs: Not on file  Physical Activity: Not on file  Stress: Not on file  Social Connections: Not on file   Today's Vitals   05/21/23 1324  BP: 126/80  Pulse: 73  Resp: 16  Temp: 97.8 F (36.6 C)  TempSrc: Oral  SpO2: 99%  Weight: 157 lb (71.2 kg)  Height: 5' 4.5" (1.638 m)   Body mass index is 26.53 kg/m.  Wt Readings from Last 3 Encounters:  05/21/23 157 lb (71.2 kg)  01/10/23 157 lb 12.8 oz (71.6 kg)  08/02/22 155 lb 4  oz (70.4 kg)   Physical Exam Vitals and nursing note reviewed.  Constitutional:      General: She is not in acute distress.    Appearance: She is well-developed and well-groomed.  HENT:     Head: Normocephalic and atraumatic.     Right Ear: Hearing, tympanic membrane, ear canal and external ear normal.     Left Ear: Hearing, tympanic membrane, ear canal and external ear normal.     Mouth/Throat:     Mouth: Mucous membranes are moist.     Pharynx: Oropharynx is clear. Uvula midline.  Eyes:     Conjunctiva/sclera: Conjunctivae normal.     Pupils: Pupils are equal, round, and reactive to light.  Neck:     Thyroid: Thyromegaly present. No thyroid mass.  Cardiovascular:     Rate and Rhythm: Normal rate and regular rhythm.     Pulses:          Dorsalis pedis pulses are 2+ on the right side and 2+ on the left side.     Heart sounds: No murmur heard. Pulmonary:     Effort: Pulmonary effort is normal. No respiratory distress.     Breath sounds: Normal breath sounds.  Abdominal:     Palpations: Abdomen is soft. There is no hepatomegaly or mass.      Tenderness: There is no abdominal tenderness.  Genitourinary:    Comments: Deferred to gyn. Musculoskeletal:     Right lower leg: No edema.     Left lower leg: No edema.     Comments: No signs of synovitis appreciated.  Lymphadenopathy:     Cervical: No cervical adenopathy.     Upper Body:     Right upper body: No supraclavicular adenopathy.     Left upper body: No supraclavicular adenopathy.  Skin:    General: Skin is warm.     Findings: No erythema or rash.  Neurological:     General: No focal deficit present.     Mental Status: She is alert and oriented to person, place, and time.     Cranial Nerves: No cranial nerve deficit.     Sensory: No sensory deficit.     Motor: No weakness.     Coordination: Coordination normal.     Gait: Gait normal.     Deep Tendon Reflexes:     Reflex Scores:      Bicep reflexes are 2+ on the right side and 2+ on the left side.      Patellar reflexes are 2+ on the right side and 2+ on the left side. Psychiatric:        Mood and Affect: Mood and affect normal.   ASSESSMENT AND PLAN:  Ms. Stephanie Henry was here today for her annual physical examination.  Lab Results  Component Value Date   CHOL 208 (H) 05/21/2023   HDL 45.10 05/21/2023   LDLCALC 143 (H) 05/21/2023   TRIG 100.0 05/21/2023   CHOLHDL 5 05/21/2023   Lab Results  Component Value Date   TSH 1.11 05/21/2023   Lab Results  Component Value Date   NA 140 05/21/2023   CL 103 05/21/2023   K 3.7 05/21/2023   CO2 30 05/21/2023   BUN 13 05/21/2023   CREATININE 0.67 05/21/2023   GFR 87.81 05/21/2023   CALCIUM 9.4 05/21/2023   ALBUMIN 4.4 05/21/2023   GLUCOSE 104 (H) 05/21/2023   Lab Results  Component Value Date   ALT 17 05/21/2023   AST 17 05/21/2023  ALKPHOS 57 05/21/2023   BILITOT 0.6 05/21/2023   Lab Results  Component Value Date   HGBA1C 5.8 05/21/2023   Routine general medical examination at a health care facility Assessment & Plan: We discussed the  importance of regular physical activity and healthy diet for prevention of chronic illness and/or complications. Preventive guidelines reviewed. Vaccination up to date. Ca++ and vit D supplementation to continue. Continue following with gynecologist for her female preventive care. Next CPE in a year.   Hyperlipidemia, unspecified hyperlipidemia type Assessment & Plan: Continue non pharmacologic treatment. Further recommendations will be given according to 10 years CVD risk score and lipid panel numbers.  Orders: -     Comprehensive metabolic panel; Future -     Lipid panel; Future  Multiple thyroid nodules/multinodular goiter Assessment & Plan: Due for thyroid US, order placed. We will continue following TSH annually.  Orders: -     US THYROID; Future -     TSH; Future  Prediabetes Assessment & Plan: Encouraged consistency with a healthy life style for diabetes prevention. Further recommendations according to HgA1C.  Orders: -     Hemoglobin A1c; Future  Colon cancer screening -     Ambulatory referral to Gastroenterology  Return in 1 year (on 05/20/2024) for CPE, chronic problems, Labs.  I, Rolla Etienne Wierda, acting as a scribe for Erie Sica [place], MD., have documented all relevant documentation on the behalf of Stephanie Roselli [place], MD, as directed by  Daphne Karrer [place], MD while in the presence of Graciemae Delisle [place], MD.   I, Senan Urey [place], MD, have reviewed all documentation for this visit. The documentation on 05/21/23 for the exam, diagnosis, procedures, and orders are all accurate and complete.  Ashliegh Parekh G. [place], MD  [facility]. [place] [facility].

## 2023-05-21 NOTE — Assessment & Plan Note (Signed)
 We discussed the importance of regular physical activity and healthy diet for prevention of chronic illness and/or complications. Preventive guidelines reviewed. Vaccination up to date. Ca++ and vit D supplementation to continue. Continue following with gynecologist for her female preventive care. Next CPE in a year.

## 2023-05-22 ENCOUNTER — Encounter: Payer: Self-pay | Admitting: Family Medicine

## 2023-05-28 ENCOUNTER — Ambulatory Visit
Admission: RE | Admit: 2023-05-28 | Discharge: 2023-05-28 | Disposition: A | Discharge status: Home / Self Care | Payer: BC Managed Care – PPO | Source: Ambulatory Visit | Attending: Family Medicine | Admitting: Family Medicine

## 2023-05-28 DIAGNOSIS — E042 Nontoxic multinodular goiter: Secondary | ICD-10-CM

## 2023-05-28 DIAGNOSIS — H40023 Open angle with borderline findings, high risk, bilateral: Secondary | ICD-10-CM | POA: Diagnosis not present

## 2023-05-28 DIAGNOSIS — E041 Nontoxic single thyroid nodule: Secondary | ICD-10-CM | POA: Diagnosis not present

## 2023-05-30 ENCOUNTER — Encounter: Payer: Self-pay | Admitting: Family Medicine

## 2023-05-30 ENCOUNTER — Telehealth (INDEPENDENT_AMBULATORY_CARE_PROVIDER_SITE_OTHER): Payer: Self-pay | Admitting: Otolaryngology

## 2023-05-30 NOTE — Telephone Encounter (Signed)
 Reminder Call: Date: 05/31/2023 Status: Sch  Time: 1:20 PM [address] [place], [place] [address]  Confirmed time and location w/patient.

## 2023-05-31 ENCOUNTER — Ambulatory Visit (INDEPENDENT_AMBULATORY_CARE_PROVIDER_SITE_OTHER): Payer: BC Managed Care – PPO | Admitting: Otolaryngology

## 2023-05-31 ENCOUNTER — Encounter (INDEPENDENT_AMBULATORY_CARE_PROVIDER_SITE_OTHER): Payer: Self-pay

## 2023-05-31 VITALS — BP 128/74 | HR 71 | Ht 64.0 in | Wt 154.0 lb

## 2023-05-31 DIAGNOSIS — J3089 Other allergic rhinitis: Secondary | ICD-10-CM | POA: Diagnosis not present

## 2023-05-31 DIAGNOSIS — J301 Allergic rhinitis due to pollen: Secondary | ICD-10-CM | POA: Diagnosis not present

## 2023-05-31 DIAGNOSIS — J343 Hypertrophy of nasal turbinates: Secondary | ICD-10-CM | POA: Diagnosis not present

## 2023-05-31 DIAGNOSIS — R0981 Nasal congestion: Secondary | ICD-10-CM

## 2023-05-31 DIAGNOSIS — J310 Chronic rhinitis: Secondary | ICD-10-CM | POA: Diagnosis not present

## 2023-05-31 DIAGNOSIS — J342 Deviated nasal septum: Secondary | ICD-10-CM

## 2023-05-31 DIAGNOSIS — H903 Sensorineural hearing loss, bilateral: Secondary | ICD-10-CM

## 2023-05-31 DIAGNOSIS — J3081 Allergic rhinitis due to animal (cat) (dog) hair and dander: Secondary | ICD-10-CM | POA: Diagnosis not present

## 2023-05-31 NOTE — Progress Notes (Signed)
 Patient ID: Stephanie Henry, female   DOB: Jun 18, 1950, 73 y.o.   MRN: [id number]  Follow up: Hearing loss, chronic nasal obstruction  HPI: The patient is a 73 year old female who returns today for her follow-up evaluation.  The patient has a history of chronic nasal congestion, secondary to nasal mucosal congestion, bilateral inferior turbinate hypertrophy, and bilateral septal spurs.  She was also noted to have bilateral high-frequency sensorineural hearing loss, worse on the left side.  The patient was treated with Flonase nasal spray.  She was fitted with bilateral hearing aids.  The patient returns today complaining of occasional nasal congestion.  She uses Allegra and Flonase as needed.Marland Kitchen  Her hearing loss is stable.  She currently wears bilateral hearing aids.  Exam: General: Communicates without difficulty, well nourished, no acute distress. Head: Normocephalic, no evidence injury, no tenderness, facial buttresses intact without stepoff. Face/sinus: No tenderness to palpation and percussion. Facial movement is normal and symmetric. Eyes: PERRL, EOMI. No scleral icterus, conjunctivae clear. Neuro: CN II exam reveals vision grossly intact. No nystagmus at any point of gaze. Ears: Auricles well formed without lesions.  The ear canals and tympanic membranes are normal.  Nose: External evaluation reveals normal support and skin without lesions. Dorsum is intact. Anterior rhinoscopy reveals congested mucosa over anterior aspect of inferior turbinates and deviated septum. No purulence noted. Oral:  Oral cavity and oropharynx are intact, symmetric, without erythema or edema. Mucosa is moist without lesions. Neck: Full range of motion without pain. There is no significant lymphadenopathy. No masses palpable. Thyroid bed within normal limits to palpation. Parotid glands and submandibular glands equal bilaterally without mass. Trachea is midline. Neuro:  CN 2-12 grossly intact. Gait normal.   Assessment  1.  Both  tympanic membranes and middle ear spaces are noted to be normal. 2.  Subjectively stable bilateral high-frequency sensorineural hearing loss, slightly worse on the left side. 3.  Chronic rhinitis with nasal septal deviation and bilateral inferior turbinate hypertrophy.  Plan  1.  The physical exam findings are reviewed with the patient.   2.  Continue her allergy treatment regimen. 3.  Continue with the use of her hearing aids. 4.  The patient will return for reevaluation in 1 year with repeat hearing test.

## 2023-06-02 DIAGNOSIS — J343 Hypertrophy of nasal turbinates: Secondary | ICD-10-CM | POA: Insufficient documentation

## 2023-06-02 DIAGNOSIS — J342 Deviated nasal septum: Secondary | ICD-10-CM | POA: Insufficient documentation

## 2023-06-02 DIAGNOSIS — H903 Sensorineural hearing loss, bilateral: Secondary | ICD-10-CM | POA: Insufficient documentation

## 2023-06-02 DIAGNOSIS — J310 Chronic rhinitis: Secondary | ICD-10-CM | POA: Insufficient documentation

## 2023-06-08 DIAGNOSIS — J3081 Allergic rhinitis due to animal (cat) (dog) hair and dander: Secondary | ICD-10-CM | POA: Diagnosis not present

## 2023-06-08 DIAGNOSIS — J301 Allergic rhinitis due to pollen: Secondary | ICD-10-CM | POA: Diagnosis not present

## 2023-06-08 DIAGNOSIS — J3089 Other allergic rhinitis: Secondary | ICD-10-CM | POA: Diagnosis not present

## 2023-06-27 DIAGNOSIS — J3089 Other allergic rhinitis: Secondary | ICD-10-CM | POA: Diagnosis not present

## 2023-06-27 DIAGNOSIS — J301 Allergic rhinitis due to pollen: Secondary | ICD-10-CM | POA: Diagnosis not present

## 2023-06-27 DIAGNOSIS — J3081 Allergic rhinitis due to animal (cat) (dog) hair and dander: Secondary | ICD-10-CM | POA: Diagnosis not present

## 2023-07-06 DIAGNOSIS — H40023 Open angle with borderline findings, high risk, bilateral: Secondary | ICD-10-CM | POA: Diagnosis not present

## 2023-07-09 ENCOUNTER — Encounter: Payer: Self-pay | Admitting: Internal Medicine

## 2023-07-12 DIAGNOSIS — J3089 Other allergic rhinitis: Secondary | ICD-10-CM | POA: Diagnosis not present

## 2023-07-12 DIAGNOSIS — J3081 Allergic rhinitis due to animal (cat) (dog) hair and dander: Secondary | ICD-10-CM | POA: Diagnosis not present

## 2023-07-17 DIAGNOSIS — J3081 Allergic rhinitis due to animal (cat) (dog) hair and dander: Secondary | ICD-10-CM | POA: Diagnosis not present

## 2023-07-17 DIAGNOSIS — J301 Allergic rhinitis due to pollen: Secondary | ICD-10-CM | POA: Diagnosis not present

## 2023-07-17 DIAGNOSIS — J3089 Other allergic rhinitis: Secondary | ICD-10-CM | POA: Diagnosis not present

## 2023-07-27 DIAGNOSIS — J3089 Other allergic rhinitis: Secondary | ICD-10-CM | POA: Diagnosis not present

## 2023-07-27 DIAGNOSIS — J3081 Allergic rhinitis due to animal (cat) (dog) hair and dander: Secondary | ICD-10-CM | POA: Diagnosis not present

## 2023-08-03 DIAGNOSIS — J3081 Allergic rhinitis due to animal (cat) (dog) hair and dander: Secondary | ICD-10-CM | POA: Diagnosis not present

## 2023-08-03 DIAGNOSIS — J301 Allergic rhinitis due to pollen: Secondary | ICD-10-CM | POA: Diagnosis not present

## 2023-08-03 DIAGNOSIS — J3089 Other allergic rhinitis: Secondary | ICD-10-CM | POA: Diagnosis not present

## 2023-08-17 DIAGNOSIS — J3081 Allergic rhinitis due to animal (cat) (dog) hair and dander: Secondary | ICD-10-CM | POA: Diagnosis not present

## 2023-08-17 DIAGNOSIS — J3089 Other allergic rhinitis: Secondary | ICD-10-CM | POA: Diagnosis not present

## 2023-08-23 DIAGNOSIS — J3081 Allergic rhinitis due to animal (cat) (dog) hair and dander: Secondary | ICD-10-CM | POA: Diagnosis not present

## 2023-08-23 DIAGNOSIS — J3089 Other allergic rhinitis: Secondary | ICD-10-CM | POA: Diagnosis not present

## 2023-08-30 DIAGNOSIS — J3089 Other allergic rhinitis: Secondary | ICD-10-CM | POA: Diagnosis not present

## 2023-08-30 DIAGNOSIS — J301 Allergic rhinitis due to pollen: Secondary | ICD-10-CM | POA: Diagnosis not present

## 2023-08-30 DIAGNOSIS — J3081 Allergic rhinitis due to animal (cat) (dog) hair and dander: Secondary | ICD-10-CM | POA: Diagnosis not present

## 2023-09-07 DIAGNOSIS — J3081 Allergic rhinitis due to animal (cat) (dog) hair and dander: Secondary | ICD-10-CM | POA: Diagnosis not present

## 2023-09-07 DIAGNOSIS — J301 Allergic rhinitis due to pollen: Secondary | ICD-10-CM | POA: Diagnosis not present

## 2023-09-07 DIAGNOSIS — J3089 Other allergic rhinitis: Secondary | ICD-10-CM | POA: Diagnosis not present

## 2023-09-14 DIAGNOSIS — J3081 Allergic rhinitis due to animal (cat) (dog) hair and dander: Secondary | ICD-10-CM | POA: Diagnosis not present

## 2023-09-14 DIAGNOSIS — J3089 Other allergic rhinitis: Secondary | ICD-10-CM | POA: Diagnosis not present

## 2023-09-14 DIAGNOSIS — J301 Allergic rhinitis due to pollen: Secondary | ICD-10-CM | POA: Diagnosis not present

## 2023-09-19 DIAGNOSIS — J3081 Allergic rhinitis due to animal (cat) (dog) hair and dander: Secondary | ICD-10-CM | POA: Diagnosis not present

## 2023-09-19 DIAGNOSIS — J301 Allergic rhinitis due to pollen: Secondary | ICD-10-CM | POA: Diagnosis not present

## 2023-09-19 DIAGNOSIS — J3089 Other allergic rhinitis: Secondary | ICD-10-CM | POA: Diagnosis not present

## 2023-09-25 ENCOUNTER — Encounter (INDEPENDENT_AMBULATORY_CARE_PROVIDER_SITE_OTHER): Payer: Self-pay | Admitting: Otolaryngology

## 2023-09-25 ENCOUNTER — Ambulatory Visit (INDEPENDENT_AMBULATORY_CARE_PROVIDER_SITE_OTHER): Admitting: Otolaryngology

## 2023-09-25 VITALS — BP 114/65 | HR 78 | Ht 64.5 in | Wt 152.0 lb

## 2023-09-25 DIAGNOSIS — H6983 Other specified disorders of Eustachian tube, bilateral: Secondary | ICD-10-CM | POA: Diagnosis not present

## 2023-09-25 DIAGNOSIS — J310 Chronic rhinitis: Secondary | ICD-10-CM

## 2023-09-25 DIAGNOSIS — J342 Deviated nasal septum: Secondary | ICD-10-CM | POA: Diagnosis not present

## 2023-09-25 DIAGNOSIS — J343 Hypertrophy of nasal turbinates: Secondary | ICD-10-CM | POA: Diagnosis not present

## 2023-09-25 DIAGNOSIS — H903 Sensorineural hearing loss, bilateral: Secondary | ICD-10-CM

## 2023-09-25 DIAGNOSIS — R0981 Nasal congestion: Secondary | ICD-10-CM

## 2023-09-26 DIAGNOSIS — H6983 Other specified disorders of Eustachian tube, bilateral: Secondary | ICD-10-CM | POA: Insufficient documentation

## 2023-09-26 NOTE — Progress Notes (Signed)
 Patient ID: Stephanie Henry, female   DOB: 01/17/1951, 73 y.o.   MRN: [id number]  Follow up: Hearing loss, chronic nasal obstruction   HPI: The patient is a 73 year old female who returns today for her follow-up evaluation.  The patient was previously seen for chronic nasal congestion, secondary to nasal septal deviation and bilateral inferior turbinate hypertrophy.  She was also noted to have bilateral high-frequency sensorineural hearing loss, worse on the left side.  The patient will be treated with Flonase  nasal spray and bilateral hearing aids.  The patient returns today complaining of clogging sensation in the ears and her nose.  Her nose is chronically congested.  The clogging sensation in her ear is have worsened over the past month.  She has been using Flonase  intermittently.  Currently she denies any facial pain, fever, or back change.  Exam: General: Communicates without difficulty, well nourished, no acute distress. Head: Normocephalic, no evidence injury, no tenderness, facial buttresses intact without stepoff. Face/sinus: No tenderness to palpation and percussion. Facial movement is normal and symmetric. Eyes: PERRL, EOMI. No scleral icterus, conjunctivae clear. Neuro: CN II exam reveals vision grossly intact.  No nystagmus at any point of gaze. Ears: Auricles well formed without lesions.  Ear canals are intact without mass or lesion.  No erythema or edema is appreciated.  The TMs are intact without fluid. Nose: External evaluation reveals normal support and skin without lesions.  Dorsum is intact.  Anterior rhinoscopy reveals congested mucosa over anterior aspect of inferior turbinates and intact septum.  No purulence noted. Oral:  Oral cavity and oropharynx are intact, symmetric, without erythema or edema.  Mucosa is moist without lesions. Neck: Full range of motion without pain.  There is no significant lymphadenopathy.  No masses palpable.  Thyroid  bed within normal limits to palpation.   Parotid glands and submandibular glands equal bilaterally without mass.  Trachea is midline. Neuro:  CN 2-12 grossly intact.   Procedure:  Flexible Nasal Endoscopy: Description: Risks, benefits, and alternatives of flexible endoscopy were explained to the patient.  Specific mention was made of the risk of throat numbness with difficulty swallowing, possible bleeding from the nose and mouth, and pain from the procedure.  The patient gave oral consent to proceed.  The flexible scope was inserted into the right nasal cavity.  Endoscopy of the interior nasal cavity, superior, inferior, and middle meatus was performed. The sphenoid-ethmoid recess was examined. Edematous mucosa was noted.  No polyp, mass, or lesion was appreciated. Nasal septal deviation noted. Olfactory cleft was clear.  Nasopharynx was clear.  Turbinates were hypertrophied but without mass.  The procedure was repeated on the contralateral side with similar findings.  The patient tolerated the procedure well.   Assessment: 1.  Chronic rhinitis with nasal mucosal congestion, nasal septal deviation, and bilateral inferior turbinate hypertrophy. 2.  Subjectively stable bilateral high-frequency sensorineural hearing loss. 3.  Bilateral eustachian tube dysfunction.  Her ear canals, tympanic membranes, and middle ear spaces are otherwise normal.  Plan: 1.  The physical exam and nasal endoscopy findings are reviewed with the patient. 2.  Continue with Flonase  nasal spray 2 sprays each nostril daily.  The importance of consistent daily discussed. 3.  Nasal saline irrigation is encouraged. 4.  Valsalva exercise multiple times a day. 5.  Continue the use of her hearing aids. 6.  The patient will return for reevaluation in 6 weeks.  If she continues to be symptomatic, she may be a candidate for surgical intervention with septoplasty and  bilateral turbinate reduction.

## 2023-09-27 DIAGNOSIS — J3081 Allergic rhinitis due to animal (cat) (dog) hair and dander: Secondary | ICD-10-CM | POA: Diagnosis not present

## 2023-09-27 DIAGNOSIS — J3089 Other allergic rhinitis: Secondary | ICD-10-CM | POA: Diagnosis not present

## 2023-09-27 DIAGNOSIS — J301 Allergic rhinitis due to pollen: Secondary | ICD-10-CM | POA: Diagnosis not present

## 2023-10-09 ENCOUNTER — Encounter: Payer: Self-pay | Admitting: Internal Medicine

## 2023-10-12 DIAGNOSIS — J3081 Allergic rhinitis due to animal (cat) (dog) hair and dander: Secondary | ICD-10-CM | POA: Diagnosis not present

## 2023-10-12 DIAGNOSIS — J3089 Other allergic rhinitis: Secondary | ICD-10-CM | POA: Diagnosis not present

## 2023-10-19 DIAGNOSIS — J3089 Other allergic rhinitis: Secondary | ICD-10-CM | POA: Diagnosis not present

## 2023-10-19 DIAGNOSIS — J3081 Allergic rhinitis due to animal (cat) (dog) hair and dander: Secondary | ICD-10-CM | POA: Diagnosis not present

## 2023-10-19 DIAGNOSIS — J301 Allergic rhinitis due to pollen: Secondary | ICD-10-CM | POA: Diagnosis not present

## 2023-10-26 DIAGNOSIS — J301 Allergic rhinitis due to pollen: Secondary | ICD-10-CM | POA: Diagnosis not present

## 2023-10-26 DIAGNOSIS — J3081 Allergic rhinitis due to animal (cat) (dog) hair and dander: Secondary | ICD-10-CM | POA: Diagnosis not present

## 2023-10-26 DIAGNOSIS — J3089 Other allergic rhinitis: Secondary | ICD-10-CM | POA: Diagnosis not present

## 2023-10-31 DIAGNOSIS — J3089 Other allergic rhinitis: Secondary | ICD-10-CM | POA: Diagnosis not present

## 2023-10-31 DIAGNOSIS — J3081 Allergic rhinitis due to animal (cat) (dog) hair and dander: Secondary | ICD-10-CM | POA: Diagnosis not present

## 2023-11-08 ENCOUNTER — Ambulatory Visit (INDEPENDENT_AMBULATORY_CARE_PROVIDER_SITE_OTHER): Admitting: Otolaryngology

## 2023-11-08 ENCOUNTER — Encounter (INDEPENDENT_AMBULATORY_CARE_PROVIDER_SITE_OTHER): Payer: Self-pay | Admitting: Otolaryngology

## 2023-11-08 VITALS — BP 115/58 | HR 68

## 2023-11-08 DIAGNOSIS — R0981 Nasal congestion: Secondary | ICD-10-CM

## 2023-11-08 DIAGNOSIS — J310 Chronic rhinitis: Secondary | ICD-10-CM | POA: Diagnosis not present

## 2023-11-08 DIAGNOSIS — H6983 Other specified disorders of Eustachian tube, bilateral: Secondary | ICD-10-CM | POA: Diagnosis not present

## 2023-11-08 DIAGNOSIS — J343 Hypertrophy of nasal turbinates: Secondary | ICD-10-CM | POA: Diagnosis not present

## 2023-11-08 DIAGNOSIS — J342 Deviated nasal septum: Secondary | ICD-10-CM | POA: Diagnosis not present

## 2023-11-10 NOTE — Progress Notes (Signed)
 Patient ID: Stephanie Henry, female   DOB: 1950-07-26, 73 y.o.   MRN: [id number]  Follow-up: Chronic nasal obstruction, eustachian tube dysfunction  HPI: The patient is a 73 year old female who returns today for follow-up evaluation.  She was previously seen for chronic nasal obstruction, secondary to nasal septal deviation and bilateral inferior turbinate hypertrophy.  She was also noted to have eustachian tube dysfunction.  She was treated with Flonase  nasal spray and Valsalva exercise.  The patient returns today reporting improvement in her nasal congestion and her eustachian tube dysfunction.  Currently she denies any otalgia, otorrhea, facial pain, or fever.  Exam: General: Communicates without difficulty, well nourished, no acute distress. Head: Normocephalic, no evidence injury, no tenderness, facial buttresses intact without stepoff. Face/sinus: No tenderness to palpation and percussion. Facial movement is normal and symmetric. Eyes: PERRL, EOMI. No scleral icterus, conjunctivae clear. Neuro: CN II exam reveals vision grossly intact.  No nystagmus at any point of gaze. Ears: Auricles well formed without lesions.  Ear canals are intact without mass or lesion.  No erythema or edema is appreciated.  The TMs are intact without fluid. Nose: External evaluation reveals normal support and skin without lesions.  Dorsum is intact.  Anterior rhinoscopy reveals congested mucosa over anterior aspect of inferior turbinates and deviated septum.  No purulence noted. Oral:  Oral cavity and oropharynx are intact, symmetric, without erythema or edema.  Mucosa is moist without lesions. Neck: Full range of motion without pain.  There is no significant lymphadenopathy.  No masses palpable.  Thyroid  bed within normal limits to palpation.  Parotid glands and submandibular glands equal bilaterally without mass.  Trachea is midline. Neuro:  CN 2-12 grossly intact.   Assessment: 1.  Chronic rhinitis with nasal mucosal  congestion, nasal septal deviation, and bilateral inferior turbinate hypertrophy.  The severity of her nasal congestion has decreased. 2.  Clinically improved bilateral eustachian tube dysfunction.  Her ear canals and tympanic membranes are noted to be normal.  Plan: 1.  The physical exam findings are reviewed with the patient. 2.  Continue with daily Flonase  nasal spray and nasal saline irrigation. 3.  Valsalva exercise as needed. 4.  The patient will return for reevaluation in 6 months.

## 2023-11-14 DIAGNOSIS — J3081 Allergic rhinitis due to animal (cat) (dog) hair and dander: Secondary | ICD-10-CM | POA: Diagnosis not present

## 2023-11-14 DIAGNOSIS — J3089 Other allergic rhinitis: Secondary | ICD-10-CM | POA: Diagnosis not present

## 2023-11-20 ENCOUNTER — Ambulatory Visit (AMBULATORY_SURGERY_CENTER)

## 2023-11-20 VITALS — Ht 64.5 in | Wt 151.0 lb

## 2023-11-20 DIAGNOSIS — Z1211 Encounter for screening for malignant neoplasm of colon: Secondary | ICD-10-CM

## 2023-11-20 MED ORDER — NA SULFATE-K SULFATE-MG SULF 17.5-3.13-1.6 GM/177ML PO SOLN: 1.0000 | Freq: Once | ORAL | 0 refills | Status: AC

## 2023-11-20 NOTE — Progress Notes (Signed)

## 2023-11-23 DIAGNOSIS — J3089 Other allergic rhinitis: Secondary | ICD-10-CM | POA: Diagnosis not present

## 2023-11-23 DIAGNOSIS — J301 Allergic rhinitis due to pollen: Secondary | ICD-10-CM | POA: Diagnosis not present

## 2023-11-23 DIAGNOSIS — J3081 Allergic rhinitis due to animal (cat) (dog) hair and dander: Secondary | ICD-10-CM | POA: Diagnosis not present

## 2023-12-03 NOTE — Progress Notes (Unsigned)
 [facility] History and Physical   Primary Care Physician:  [place], Betty G, MD   Reason for Procedure:    Encounter Diagnosis  Name Primary?   History of colonic polyps Yes     Plan:    Colonoscopy     HPI: Stephanie Henry is a 73 y.o. female with a history of the removal of a diminutive adenoma in 2004.  Colonoscopy in 2009 revealed some diminutive hyperplastic polyps and a colonoscopy in 2015 did not show any polyps.  She presents for a surveillance examination today.  Pediatric colonoscope was used on last examination.  That exam was otherwise normal also.   Past Medical History:  Diagnosis Date   Allergy    Arthritis    Cancer (HCC)    basal cell skin cancer    Past Surgical History:  Procedure Laterality Date   CESAREAN SECTION     x3   CHOLECYSTECTOMY     LIPOMA EXCISION N/A 02/14/2017   Procedure: EXCISION POSTERIOR NECK CYST;  Surgeon: Vernetta Berg, MD;  Location: [facility] OR;  Service: General;  Laterality: N/A;   NASAL RECONSTRUCTION     PARTIAL HYSTERECTOMY     TONSILLECTOMY       Current Outpatient Medications  Medication Sig Dispense Refill   betamethasone  dipropionate (DIPROLENE ) 0.05 % cream Apply 1 application daily as needed topically (rash on elbow).  (Patient not taking: Reported on 11/20/2023)     Calcium Carb-Cholecalciferol (CALCIUM + D3) 600-200 MG-UNIT TABS Take 1 tablet by mouth daily.     EPINEPHrine  0.3 mg/0.3 mL IJ SOAJ injection epinephrine  0.3 mg/0.3 mL injection, auto-injector     estradiol (ESTRACE) 0.1 MG/GM vaginal cream Place 0.5 g vaginally 2 (two) times a week.     Fexofenadine HCl (ALLEGRA ALLERGY PO) Allegra Allergy     fluticasone  (FLONASE  SENSIMIST) 27.5 MCG/SPRAY nasal spray Place 2 sprays into the nose daily.     ibuprofen (ADVIL,MOTRIN) 200 MG tablet Take 200 mg at bedtime by mouth.      Probiotic Product (PROBIOTIC PO) Take 1 tablet by mouth daily.     No current facility-administered medications for this  visit.    Allergies as of 12/04/2023 - Review Complete 11/20/2023  Allergen Reaction Noted   Cat dander Itching 01/18/2018   Other Itching 10/18/2018   Cephalexin Rash 07/24/2013   Adhesive [tape] Other (See Comments) 02/06/2017    Family History  Problem Relation Age of Onset   Hypertension Mother    Heart disease Father        CAD   Hypertension Father    Colon cancer Neg Hx    Colon polyps Neg Hx    Esophageal cancer Neg Hx    Rectal cancer Neg Hx     Social History   Socioeconomic History   Marital status: Married    Spouse name: Not on file   Number of children: Not on file   Years of education: Not on file   Highest education level: Not on file  Occupational History   Not on file  Tobacco Use   Smoking status: Former   Smokeless tobacco: Never   Tobacco comments:    quit 30 + years ago  Vaping Use   Vaping status: Former  Substance and Sexual Activity   Alcohol use: No   Drug use: No   Sexual activity: Yes    Partners: Male  Other Topics Concern   Not on file  Social History Narrative   Not on  file   Social Drivers of Corporate investment banker Strain: Not on file  Food Insecurity: Not on file  Transportation Needs: Not on file  Physical Activity: Not on file  Stress: Not on file  Social Connections: Not on file  Intimate Partner Violence: Not on file    Review of Systems: Positive for *** All other review of systems negative except as mentioned in the HPI.  Physical Exam: Vital signs There were no vitals taken for this visit.  General:   Alert,  Well-developed, well-nourished, pleasant and cooperative in NAD Lungs:  Clear throughout to auscultation.   Heart:  Regular rate and rhythm; no murmurs, clicks, rubs,  or gallops. Abdomen:  Soft, nontender and nondistended. Normal bowel sounds.   Neuro/Psych:  Alert and cooperative. Normal mood and affect. A and O x 3   @[name]  [name], MD, [facility] [phone]  (pager) 12/03/2023 4:10 PM@

## 2023-12-04 ENCOUNTER — Encounter: Payer: Self-pay | Admitting: Internal Medicine

## 2023-12-04 ENCOUNTER — Ambulatory Visit (AMBULATORY_SURGERY_CENTER): Admitting: Internal Medicine

## 2023-12-04 VITALS — BP 145/71 | HR 67 | Temp 98.1°F | Resp 10 | Ht 64.5 in | Wt 151.0 lb

## 2023-12-04 DIAGNOSIS — D120 Benign neoplasm of cecum: Secondary | ICD-10-CM

## 2023-12-04 DIAGNOSIS — Z1211 Encounter for screening for malignant neoplasm of colon: Secondary | ICD-10-CM

## 2023-12-04 DIAGNOSIS — Z860100 Personal history of colon polyps, unspecified: Secondary | ICD-10-CM

## 2023-12-04 DIAGNOSIS — K644 Residual hemorrhoidal skin tags: Secondary | ICD-10-CM | POA: Diagnosis not present

## 2023-12-04 DIAGNOSIS — D128 Benign neoplasm of rectum: Secondary | ICD-10-CM | POA: Diagnosis not present

## 2023-12-04 MED ORDER — SODIUM CHLORIDE 0.9 % IV SOLN: 500.0000 mL | INTRAVENOUS | Status: DC

## 2023-12-04 NOTE — Op Note (Signed)
 [facility] Patient Name: Stephanie Henry Procedure Date: 12/04/2023 9:32 AM MRN: [id number] Endoscopist: Lupita FORBES Commander , MD, [id number] Age: 73 Referring MD:  Date of Birth: 1950/04/22 Gender: Female Account #: [id number] Procedure:                Colonoscopy Indications:              High risk colon cancer surveillance: Personal                            history of colonic polyps, Last colonoscopy: 2015 Medicines:                Monitored Anesthesia Care Procedure:                Pre-Anesthesia Assessment:                           - Prior to the procedure, a History and Physical                            was performed, and patient medications and                            allergies were reviewed. The patient's tolerance of                            previous anesthesia was also reviewed. The risks                            and benefits of the procedure and the sedation                            options and risks were discussed with the patient.                            All questions were answered, and informed consent                            was obtained. Prior Anticoagulants: The patient has                            taken no anticoagulant or antiplatelet agents. ASA                            Grade Assessment: II - A patient with mild systemic                            disease. After reviewing the risks and benefits,                            the patient was deemed in satisfactory condition to                            undergo the procedure.  After obtaining informed consent, the colonoscope                            was passed under direct vision. Throughout the                            procedure, the patient's blood pressure, pulse, and                            oxygen saturations were monitored continuously. The                            PCF-HQ190L Colonoscope [id number] was introduced                            through the anus  and advanced to the the cecum,                            identified by appendiceal orifice and ileocecal                            valve. The colonoscopy was performed without                            difficulty. The patient tolerated the procedure                            well. The quality of the bowel preparation was                            adequate. The bowel preparation used was SUPREP via                            split dose instruction. The ileocecal valve,                            appendiceal orifice, and rectum were photographed. Scope In: 9:45:03 AM Scope Out: 10:09:27 AM Scope Withdrawal Time: 0 hours 18 minutes 3 seconds  Total Procedure Duration: 0 hours 24 minutes 24 seconds  Findings:                 Hemorrhoids were found on perianal exam.                           Two sessile polyps were found in the rectum and                            cecum. The polyps were diminutive in size. These                            polyps were removed with a cold snare. Resection                            and retrieval were complete. Verification of  patient identification for the specimen was done.                            Estimated blood loss was minimal.                           The exam was otherwise without abnormality on                            direct and retroflexion views. Complications:            No immediate complications. Estimated Blood Loss:     Estimated blood loss was minimal. Impression:               - Hemorrhoids found on perianal exam and                            retroflexion (external hemorrhoids)                           - Two diminutive polyps in the rectum and in the                            cecum, removed with a cold snare. Resected and                            retrieved.                           - The examination was otherwise normal on direct                            and retroflexion views. Recommendation:            - Patient has a contact number available for                            emergencies. The signs and symptoms of potential                            delayed complications were discussed with the                            patient. Return to normal activities tomorrow.                            Written discharge instructions were provided to the                            patient.                           - Resume previous diet.                           - Continue present medications.                           -  Await pathology results. She has a hx of                            diminutive adenoma in 2004.                           - No recommendation at this time regarding repeat                            colonoscopy due to age. Lupita FORBES Commander, MD 12/04/2023 89:73:77 AM This report has been signed electronically.

## 2023-12-04 NOTE — Progress Notes (Signed)
 Pt's states no medical or surgical changes since previsit or office visit.

## 2023-12-04 NOTE — Progress Notes (Signed)
 To pacu, VSS. Report to RN.tb

## 2023-12-04 NOTE — Progress Notes (Signed)
 Called to room to assist during endoscopic procedure.  Patient ID and intended procedure confirmed with present staff. Received instructions for my participation in the procedure from the performing physician.

## 2023-12-04 NOTE — Patient Instructions (Addendum)
 Please read handouts provided. Continue present medications. Await pathology results. Resume previous diet.  YOU HAD AN ENDOSCOPIC PROCEDURE TODAY AT THE [facility]:   Refer to the procedure report that was given to you for any specific questions about what was found during the examination.  If the procedure report does not answer your questions, please call your gastroenterologist to clarify.  If you requested that your care partner not be given the details of your procedure findings, then the procedure report has been included in a sealed envelope for you to review at your convenience later.  YOU SHOULD EXPECT: Some feelings of bloating in the abdomen. Passage of more gas than usual.  Walking can help get rid of the air that was put into your GI tract during the procedure and reduce the bloating. If you had a lower endoscopy (such as a colonoscopy or flexible sigmoidoscopy) you may notice spotting of blood in your stool or on the toilet paper. If you underwent a bowel prep for your procedure, you may not have a normal bowel movement for a few days.  Please Note:  You might notice some irritation and congestion in your nose or some drainage.  This is from the oxygen used during your procedure.  There is no need for concern and it should clear up in a day or so.  SYMPTOMS TO REPORT IMMEDIATELY:  Following lower endoscopy (colonoscopy or flexible sigmoidoscopy):  Excessive amounts of blood in the stool  Significant tenderness or worsening of abdominal pains  Swelling of the abdomen that is new, acute  Fever of 100F or higher.  For urgent or emergent issues, a gastroenterologist can be reached at any hour by calling [phone]. Do not use MyChart messaging for urgent concerns.    DIET:  We do recommend a small meal at first, but then you may proceed to your regular diet.  Drink plenty of fluids but you should avoid alcoholic beverages for 24 hours.  ACTIVITY:  You should  plan to take it easy for the rest of today and you should NOT DRIVE or use heavy machinery until tomorrow (because of the sedation medicines used during the test).    FOLLOW UP: Our staff will call the number listed on your records the next business day following your procedure.  We will call around 7:15- 8:00 am to check on you and address any questions or concerns that you may have regarding the information given to you following your procedure. If we do not reach you, we will leave a message.     If any biopsies were taken you will be contacted by phone or by letter within the next 1-3 weeks.  Please call us  at [phone] if you have not heard about the biopsies in 3 weeks.    SIGNATURES/CONFIDENTIALITY: You and/or your care partner have signed paperwork which will be entered into your electronic medical record.  These signatures attest to the fact that that the information above on your After Visit Summary has been reviewed and is understood.  Full responsibility of the confidentiality of this discharge information lies with you and/or your care-partner.I found and removed 2 tiny polyps today. I will let you know pathology results and when/if to have another routine colonoscopy by mail and/or My Chart.  Also saw hemorrhoids.  I appreciate the opportunity to care for you. Lupita [name], MD, [facility]

## 2023-12-05 ENCOUNTER — Telehealth: Payer: Self-pay

## 2023-12-05 NOTE — Telephone Encounter (Signed)
 No answer after follow up call. Voice message left.

## 2023-12-06 LAB — SURGICAL PATHOLOGY

## 2023-12-07 DIAGNOSIS — J3081 Allergic rhinitis due to animal (cat) (dog) hair and dander: Secondary | ICD-10-CM | POA: Diagnosis not present

## 2023-12-07 DIAGNOSIS — J301 Allergic rhinitis due to pollen: Secondary | ICD-10-CM | POA: Diagnosis not present

## 2023-12-07 DIAGNOSIS — J3089 Other allergic rhinitis: Secondary | ICD-10-CM | POA: Diagnosis not present

## 2023-12-10 ENCOUNTER — Ambulatory Visit: Payer: Self-pay | Admitting: Internal Medicine

## 2023-12-14 DIAGNOSIS — J301 Allergic rhinitis due to pollen: Secondary | ICD-10-CM | POA: Diagnosis not present

## 2023-12-14 DIAGNOSIS — J3081 Allergic rhinitis due to animal (cat) (dog) hair and dander: Secondary | ICD-10-CM | POA: Diagnosis not present

## 2023-12-14 DIAGNOSIS — J3089 Other allergic rhinitis: Secondary | ICD-10-CM | POA: Diagnosis not present

## 2023-12-20 DIAGNOSIS — J3089 Other allergic rhinitis: Secondary | ICD-10-CM | POA: Diagnosis not present

## 2023-12-20 DIAGNOSIS — J3081 Allergic rhinitis due to animal (cat) (dog) hair and dander: Secondary | ICD-10-CM | POA: Diagnosis not present

## 2023-12-27 DIAGNOSIS — J3081 Allergic rhinitis due to animal (cat) (dog) hair and dander: Secondary | ICD-10-CM | POA: Diagnosis not present

## 2023-12-27 DIAGNOSIS — J3089 Other allergic rhinitis: Secondary | ICD-10-CM | POA: Diagnosis not present

## 2023-12-28 DIAGNOSIS — J301 Allergic rhinitis due to pollen: Secondary | ICD-10-CM | POA: Diagnosis not present

## 2023-12-28 DIAGNOSIS — J3081 Allergic rhinitis due to animal (cat) (dog) hair and dander: Secondary | ICD-10-CM | POA: Diagnosis not present

## 2023-12-28 DIAGNOSIS — J3089 Other allergic rhinitis: Secondary | ICD-10-CM | POA: Diagnosis not present

## 2024-01-04 DIAGNOSIS — J3089 Other allergic rhinitis: Secondary | ICD-10-CM | POA: Diagnosis not present

## 2024-01-04 DIAGNOSIS — J301 Allergic rhinitis due to pollen: Secondary | ICD-10-CM | POA: Diagnosis not present

## 2024-01-04 DIAGNOSIS — J3081 Allergic rhinitis due to animal (cat) (dog) hair and dander: Secondary | ICD-10-CM | POA: Diagnosis not present

## 2024-01-25 DIAGNOSIS — J301 Allergic rhinitis due to pollen: Secondary | ICD-10-CM | POA: Diagnosis not present

## 2024-01-25 DIAGNOSIS — J3081 Allergic rhinitis due to animal (cat) (dog) hair and dander: Secondary | ICD-10-CM | POA: Diagnosis not present

## 2024-01-25 DIAGNOSIS — J3089 Other allergic rhinitis: Secondary | ICD-10-CM | POA: Diagnosis not present

## 2024-02-15 DIAGNOSIS — J3089 Other allergic rhinitis: Secondary | ICD-10-CM | POA: Diagnosis not present

## 2024-02-15 DIAGNOSIS — J301 Allergic rhinitis due to pollen: Secondary | ICD-10-CM | POA: Diagnosis not present

## 2024-02-15 DIAGNOSIS — J3081 Allergic rhinitis due to animal (cat) (dog) hair and dander: Secondary | ICD-10-CM | POA: Diagnosis not present

## 2024-02-26 DIAGNOSIS — J3089 Other allergic rhinitis: Secondary | ICD-10-CM | POA: Diagnosis not present

## 2024-02-26 DIAGNOSIS — J3081 Allergic rhinitis due to animal (cat) (dog) hair and dander: Secondary | ICD-10-CM | POA: Diagnosis not present

## 2024-03-14 DIAGNOSIS — Z85828 Personal history of other malignant neoplasm of skin: Secondary | ICD-10-CM | POA: Diagnosis not present

## 2024-03-14 DIAGNOSIS — L570 Actinic keratosis: Secondary | ICD-10-CM | POA: Diagnosis not present

## 2024-03-14 DIAGNOSIS — L821 Other seborrheic keratosis: Secondary | ICD-10-CM | POA: Diagnosis not present

## 2024-03-14 DIAGNOSIS — D1801 Hemangioma of skin and subcutaneous tissue: Secondary | ICD-10-CM | POA: Diagnosis not present

## 2024-03-14 DIAGNOSIS — L718 Other rosacea: Secondary | ICD-10-CM | POA: Diagnosis not present

## 2024-05-13 ENCOUNTER — Ambulatory Visit (INDEPENDENT_AMBULATORY_CARE_PROVIDER_SITE_OTHER): Admitting: Otolaryngology

## 2024-05-21 ENCOUNTER — Encounter: Admitting: Family Medicine
